# Patient Record
Sex: Female | Born: 1985 | Race: Black or African American | Hispanic: No | Marital: Married | Smoking: Never smoker
Health system: Southern US, Community
[De-identification: ages and names within clinical notes are randomized; demographics above are authoritative.]

## PROBLEM LIST (undated history)

## (undated) ENCOUNTER — Inpatient Hospital Stay (HOSPITAL_COMMUNITY): Payer: Self-pay

## (undated) DIAGNOSIS — I10 Essential (primary) hypertension: Secondary | ICD-10-CM

## (undated) DIAGNOSIS — O139 Gestational [pregnancy-induced] hypertension without significant proteinuria, unspecified trimester: Secondary | ICD-10-CM

## (undated) DIAGNOSIS — D219 Benign neoplasm of connective and other soft tissue, unspecified: Secondary | ICD-10-CM

## (undated) DIAGNOSIS — O24419 Gestational diabetes mellitus in pregnancy, unspecified control: Secondary | ICD-10-CM

## (undated) HISTORY — DX: Gestational (pregnancy-induced) hypertension without significant proteinuria, unspecified trimester: O13.9

## (undated) HISTORY — DX: Gestational diabetes mellitus in pregnancy, unspecified control: O24.419

## (undated) HISTORY — PX: NO PAST SURGERIES: SHX2092

## (undated) HISTORY — DX: Morbid (severe) obesity due to excess calories: E66.01

## (undated) HISTORY — DX: Essential (primary) hypertension: I10

---

## 2012-04-23 LAB — HM PAP SMEAR: HM Pap smear: 4

## 2016-04-20 ENCOUNTER — Ambulatory Visit: Payer: 59 | Admitting: Family Medicine

## 2016-04-23 ENCOUNTER — Telehealth: Payer: Self-pay | Admitting: *Deleted

## 2016-04-23 ENCOUNTER — Encounter: Payer: Self-pay | Admitting: *Deleted

## 2016-04-23 NOTE — Telephone Encounter (Signed)
Pre-Visit Call completed with patient and chart updated.   Pre-Visit Info documented in Specialty Comments under [employer].    

## 2016-04-24 ENCOUNTER — Ambulatory Visit (INDEPENDENT_AMBULATORY_CARE_PROVIDER_SITE_OTHER): Payer: 59 | Admitting: Family Medicine

## 2016-04-24 ENCOUNTER — Encounter: Payer: Self-pay | Admitting: Family Medicine

## 2016-04-24 VITALS — BP 136/74 | HR 60 | Temp 98.6°F | Ht 67.0 in | Wt 267.2 lb

## 2016-04-24 DIAGNOSIS — Z13220 Encounter for screening for lipoid disorders: Secondary | ICD-10-CM | POA: Diagnosis not present

## 2016-04-24 DIAGNOSIS — Z0000 Encounter for general adult medical examination without abnormal findings: Secondary | ICD-10-CM | POA: Diagnosis not present

## 2016-04-24 DIAGNOSIS — Z114 Encounter for screening for human immunodeficiency virus [HIV]: Secondary | ICD-10-CM | POA: Diagnosis not present

## 2016-04-24 DIAGNOSIS — E669 Obesity, unspecified: Secondary | ICD-10-CM | POA: Insufficient documentation

## 2016-04-24 HISTORY — DX: Morbid (severe) obesity due to excess calories: E66.01

## 2016-04-24 LAB — COMPREHENSIVE METABOLIC PANEL
ALBUMIN: 4.1 g/dL (ref 3.6–5.1)
ALK PHOS: 51 U/L (ref 33–115)
ALT: 12 U/L (ref 6–29)
AST: 16 U/L (ref 10–30)
BUN: 10 mg/dL (ref 7–25)
CALCIUM: 9.4 mg/dL (ref 8.6–10.2)
CO2: 26 mmol/L (ref 20–31)
Chloride: 102 mmol/L (ref 98–110)
Creat: 1.02 mg/dL (ref 0.50–1.10)
Glucose, Bld: 93 mg/dL (ref 65–99)
POTASSIUM: 4.4 mmol/L (ref 3.5–5.3)
Sodium: 137 mmol/L (ref 135–146)
TOTAL PROTEIN: 7.3 g/dL (ref 6.1–8.1)
Total Bilirubin: 0.4 mg/dL (ref 0.2–1.2)

## 2016-04-24 LAB — LIPID PANEL
CHOL/HDL RATIO: 2.9 ratio (ref <=5.0)
CHOLESTEROL: 163 mg/dL (ref 125–200)
HDL: 56 mg/dL (ref >=46)
LDL Cholesterol: 91 mg/dL (ref <130)
TRIGLYCERIDES: 80 mg/dL (ref <150)
VLDL: 16 mg/dL (ref <30)

## 2016-04-24 NOTE — Progress Notes (Signed)
Pre visit review using our clinic review tool, if applicable. No additional management support is needed unless otherwise documented below in the visit note. 

## 2016-04-24 NOTE — Progress Notes (Signed)
estab

## 2016-04-24 NOTE — Progress Notes (Signed)
Chief Complaint  Patient presents with  . Establish Care    Pt reports no concerns     Well Woman Maureen Griffin is here for a complete physical.   Her last physical was 1 year(s) ago.  Current diet: improving, just joined weight watchers. Current exercise: Goes to gym 2-3 times weekliy Denies daytime fatigue Her last pap smear was normal.  4 years ago, never told she has had an abnormal period. Patient's last menstrual period was 04/20/2016..  Follow with a Dentist? Yes.   Follow with an Optometrist? No. Home safety concerns? No. Concerns: None  Past Medical History:  Diagnosis Date  . Morbid obesity ([place]) 04/24/2016    Past Surgical History:  Procedure Laterality Date  . NO PAST SURGERIES     Medications  Takes no medications routinely.   Allergies No Known Allergies  Review of Systems: Constitutional:  no unexpected change in weight, no weakness, no unexplained fevers, sweats, or chills Eye:  no recent significant change in vision Ear/Nose/Mouth/Throat:  Ears:  no tinnitus or vertigo and no recent change in hearing, Nose/Mouth/Throat:  no complaints of nasal congestion or discharge, no sore throat and no recent change in voice or hoarseness Cardiovascular:  no exercise intolerance, no chest pain, no palpitations Respiratory:  no chronic cough, sputum, or hemoptysis and no shortness of breath Gastrointestinal:  no abdominal pain, no change in bowel habits, no significant change in appetite, no nausea, vomiting, diarrhea, or constipation and no black or bloody stool GU:  Female: negative for dysuria, frequency, and incontinence, Normal menses; no abnormal bleeding, pelvic pain, or discharge Musculoskeletal/Extremities:  no pain, redness, or swelling of the joints Integumentary (Skin/Breast):  no abnormal skin lesions reported, no new breast lumps or masses Neurologic:  no chronic headaches, no numbness, tingling, or tremor Psychiatric:  no anxiety, no  depression Endocrine:  denies fatigue, weight changes, heat/cold intolerance, bowel or skin changes, or cardiovascular system symptoms Hematologic/Lymphatic:  no abnormal bleeding, no HIV risk factors, no night sweats, no swollen nodes, no weight loss Allergic/Immunologic:  no history of food or environmental allergies  Exam BP 136/74 (BP Location: Left Arm, Patient Position: Sitting, Cuff Size: Large)   Pulse 60   Temp 98.6 F (37 C) (Oral)   Ht 5\' 7"  (1.702 m)   Wt 267 lb 3.2 oz (121.2 kg)   LMP 04/20/2016   SpO2 99%   BMI 41.85 kg/m  General:  well developed, well nourished, in no apparent distress Skin:  no significant moles, warts, or growths Head:  no masses, lesions, or tenderness Eyes:  pupils equal and round, sclera anicteric without injection Ears:  canals without lesions, TMs shiny without retraction, no obvious effusion, no erythema Nose:  nares patent, septum midline, mucosa normal, and no drainage or sinus tenderness Throat/Pharynx:  lips and gingiva without lesion; tongue and uvula midline; non-inflamed pharynx; no exudates or postnasal drainage Neck: neck supple without adenopathy, thyromegaly, or masses Thorax:  nontender Lungs:  clear to auscultation, breath sounds equal bilaterally, no respiratory distress Cardio:  regular rate and rhythm without murmurs, heart sounds without clicks or rubs, point of maximal impulse normal; no lifts, heaves, or thrills Abdomen:  abdomen soft, nontender; bowel sounds normal; no masses or organomegaly Genital: Female:  Not done today Musculoskeletal:  symmetrical muscle groups noted without atrophy or deformity Extremities:  no clubbing, cyanosis, or edema, no deformities, no skin discoloration Neuro:  gait normal; difficult time relaxing for LE DTR's, 1/4 biceps reflex b/l wo clonus  Psych: well oriented with normal range of affect and appropriate judgment/insight  Assessment and Plan  Well adult exam  Screening for HIV (human  immunodeficiency virus) - Plan: HIV antibody  Morbid obesity ([place]) - Plan: CANCELED: Comprehensive metabolic panel  Screening cholesterol level - Plan: CANCELED: Lipid panel   Well 30 y.o. female. Counseled on diet and exercise. She is exercising well and working to improve her diet. Immunizations, labs, and further orders as above. Follow up in 1 year pending the above workup- if she would like to schedule her pap smear with me, schedule in next 2-4 weeks. The patient voiced understanding and agreement to the plan.  [place], DO 04/24/16 3:51 PM

## 2016-04-25 LAB — HIV ANTIBODY (ROUTINE TESTING W REFLEX): HIV 1&2 Ab, 4th Generation: NONREACTIVE

## 2016-05-22 ENCOUNTER — Encounter: Payer: Self-pay | Admitting: Family Medicine

## 2016-05-22 ENCOUNTER — Ambulatory Visit (INDEPENDENT_AMBULATORY_CARE_PROVIDER_SITE_OTHER): Payer: 59 | Admitting: Family Medicine

## 2016-05-22 ENCOUNTER — Ambulatory Visit: Payer: 59 | Admitting: Family Medicine

## 2016-05-22 DIAGNOSIS — Z0189 Encounter for other specified special examinations: Secondary | ICD-10-CM | POA: Diagnosis not present

## 2016-05-22 DIAGNOSIS — Z6841 Body Mass Index (BMI) 40.0 and over, adult: Secondary | ICD-10-CM | POA: Diagnosis not present

## 2016-05-22 NOTE — Progress Notes (Signed)
Chief Complaint  Patient presents with  . Follow-up    Subjective: Patient is a 30 y.o. female here for lab review.  Someone called pt and stated she needed to return, so she is here. We reviewed her labs. She is not having any other issues. She has not set up with GYN for her pap smear, which she is due for.   Past Medical History:  Diagnosis Date  . Morbid obesity ([place]) 04/24/2016   No Known Allergies  Takes no medications routinely.  Objective: BP 112/80 (BP Location: Left Arm, Patient Position: Sitting, Cuff Size: Large)   Pulse (!) 57   Temp 98.3 F (36.8 C) (Oral)   Ht 5\' 7"  (1.702 m)   Wt 260 lb 3.2 oz (118 kg)   LMP 05/18/2016   SpO2 99%   BMI 40.75 kg/m  General: Awake, appears stated age Heart: RRR, no murmurs  Lungs: CTAB, no rales, wheezes or rhonchi. No accessory muscle use Psych: Age appropriate judgment and insight, normal affect and mood  Assessment and Plan: Morbid obesity ([place])  Reviewed recent labs. Encouraged pt to schedule with GYN for pap smear, she is due. F/u for her yearly physical or prn. The patient voiced understanding and agreement to the plan.  [place], DO 05/22/16  9:53 AM

## 2016-05-22 NOTE — Patient Instructions (Signed)
Make sure you get set up with a GYN for your women's health.

## 2016-05-22 NOTE — Progress Notes (Signed)
Pre visit review using our clinic review tool, if applicable. No additional management support is needed unless otherwise documented below in the visit note. 

## 2016-07-06 NOTE — L&D Delivery Note (Signed)
Delivery Note At 3:10 PM a viable female was delivered via Vaginal, Spontaneous Delivery (Presentation: ROA).  APGAR: 9, 9; delivered without difficulty over an intact perineum.  3VC, delayed cord clamping.  Placenta intact, discarded.    Anesthesia:  epidural Episiotomy: None Lacerations: None Est. Blood Loss (mL): 150 Fundus firm   Mom to postpartum.  Baby to Couplet care / Skin to Skin.  Crista Luria, SNM 05/02/2017, 3:20 PM I was present for this delivery and agree with the above assessment

## 2016-09-21 ENCOUNTER — Ambulatory Visit (INDEPENDENT_AMBULATORY_CARE_PROVIDER_SITE_OTHER): Payer: 59 | Admitting: Obstetrics & Gynecology

## 2016-09-21 ENCOUNTER — Encounter: Payer: Self-pay | Admitting: Obstetrics & Gynecology

## 2016-09-21 VITALS — BP 141/69 | HR 81 | Ht 68.0 in | Wt 263.0 lb

## 2016-09-21 DIAGNOSIS — Z3201 Encounter for pregnancy test, result positive: Secondary | ICD-10-CM

## 2016-09-21 DIAGNOSIS — Z01419 Encounter for gynecological examination (general) (routine) without abnormal findings: Secondary | ICD-10-CM | POA: Diagnosis not present

## 2016-09-21 DIAGNOSIS — Z1151 Encounter for screening for human papillomavirus (HPV): Secondary | ICD-10-CM

## 2016-09-21 DIAGNOSIS — Z0000 Encounter for general adult medical examination without abnormal findings: Secondary | ICD-10-CM

## 2016-09-21 DIAGNOSIS — N912 Amenorrhea, unspecified: Secondary | ICD-10-CM

## 2016-09-21 LAB — POCT URINE PREGNANCY: Preg Test, Ur: POSITIVE — AB

## 2016-09-21 NOTE — Progress Notes (Signed)
Subjective:     Maureen Griffin is a 31 y.o. female here for a routine exam.  G1P0 LMP 08/10/2016. Pt reports monthly cycles.  Current complaints: + UPT at home 2 weeks prev.  Pt denies N/V. She c/o breast tenderness.   Gynecologic History Patient's last menstrual period was 08/10/2016 (exact date). Contraception: none Last Pap: 4 year. Results were: normal Last mammogram: n/a.   Obstetric History OB History  Gravida Para Term Preterm AB Living  1            SAB TAB Ectopic Multiple Live Births               # Outcome Date GA Lbr Len/2nd Weight Sex Delivery Anes PTL Lv  1 Gravida              The following portions of the patient's history were reviewed and updated as appropriate: allergies, current medications, past family history, past medical history, past social history, past surgical history and problem list.  Review of Systems Pertinent items are noted in HPI.    Objective:   BP (!) 141/69 (BP Location: Left Arm)   Pulse 81   Ht 5\' 8"  (1.727 m)   Wt 263 lb (119.3 kg)   LMP 08/10/2016 (Exact Date) Comment: patient had positive home pregnancy test  BMI 39.99 kg/m  General Appearance:    Alert, cooperative, no distress, appears stated age  Head:    Normocephalic, without obvious abnormality, atraumatic  Eyes:    conjunctiva/corneas clear, EOM's intact, both eyes  Ears:    Normal external ear canals, both ears  Nose:   Nares normal, septum midline, mucosa normal, no drainage    or sinus tenderness  Throat:   Lips, mucosa, and tongue normal; teeth and gums normal  Neck:   Supple, symmetrical, trachea midline, no adenopathy;    thyroid:  no enlargement/tenderness/nodules  Back:     Symmetric, no curvature, ROM normal, no CVA tenderness  Lungs:     Clear to auscultation bilaterally, respirations unlabored  Chest Wall:    No tenderness or deformity   Heart:    Regular rate and rhythm, S1 and S2 normal, no murmur, rub   or gallop  Breast Exam:    No tenderness, masses, or  nipple abnormality  Abdomen:     Soft, non-tender, bowel sounds active all four quadrants,    no masses, no organomegaly  Genitalia:    Normal female without lesion, discharge or tenderness     Extremities:   Extremities normal, atraumatic, no cyanosis or edema  Pulses:   2+ and symmetric all extremities  Skin:   Skin color, texture, turgor normal, no rashes or lesions   Urine: UPT- pos  Assessment:    Healthy female exam.   Early pregnancy just diagnosed    Plan:    Follow up in: 4 week.   for initial OB intake (exam done today) f/u PAP with HPV and cx First tri genetic screen Prenatal labs obtained today  Eline Geng L. Harraway-Smith, M.D., Cherlynn June

## 2016-09-21 NOTE — Patient Instructions (Signed)
First Trimester of Pregnancy The first trimester of pregnancy is from week 1 until the end of week 13 (months 1 through 3). A week after a sperm fertilizes an egg, the egg will implant on the wall of the uterus. This embryo will begin to develop into a baby. Genes from you and your partner will form the baby. The female genes will determine whether the baby will be a boy or a girl. At 6-8 weeks, the eyes and face will be formed, and the heartbeat can be seen on ultrasound. At the end of 12 weeks, all the baby's organs will be formed. Now that you are pregnant, you will want to do everything you can to have a healthy baby. Two of the most important things are to get good prenatal care and to follow your health care provider's instructions. Prenatal care is all the medical care you receive before the baby's birth. This care will help prevent, find, and treat any problems during the pregnancy and childbirth. Body changes during your first trimester Your body goes through many changes during pregnancy. The changes vary from woman to woman.  You may gain or lose a couple of pounds at first.  You may feel sick to your stomach (nauseous) and you may throw up (vomit). If the vomiting is uncontrollable, call your health care provider.  You may tire easily.  You may develop headaches that can be relieved by medicines. All medicines should be approved by your health care provider.  You may urinate more often. Painful urination may mean you have a bladder infection.  You may develop heartburn as a result of your pregnancy.  You may develop constipation because certain hormones are causing the muscles that push stool through your intestines to slow down.  You may develop hemorrhoids or swollen veins (varicose veins).  Your breasts may begin to grow larger and become tender. Your nipples may stick out more, and the tissue that surrounds them (areola) may become darker.  Your gums may bleed and may be  sensitive to brushing and flossing.  Dark spots or blotches (chloasma, mask of pregnancy) may develop on your face. This will likely fade after the baby is born.  Your menstrual periods will stop.  You may have a loss of appetite.  You may develop cravings for certain kinds of food.  You may have changes in your emotions from day to day, such as being excited to be pregnant or being concerned that something may go wrong with the pregnancy and baby.  You may have more vivid and strange dreams.  You may have changes in your hair. These can include thickening of your hair, rapid growth, and changes in texture. Some women also have hair loss during or after pregnancy, or hair that feels dry or thin. Your hair will most likely return to normal after your baby is born.  What to expect at prenatal visits During a routine prenatal visit:  You will be weighed to make sure you and the baby are growing normally.  Your blood pressure will be taken.  Your abdomen will be measured to track your baby's growth.  The fetal heartbeat will be listened to between weeks 10 and 14 of your pregnancy.  Test results from any previous visits will be discussed.  Your health care provider may ask you:  How you are feeling.  If you are feeling the baby move.  If you have had any abnormal symptoms, such as leaking fluid, bleeding, severe headaches,   or abdominal cramping.  If you are using any tobacco products, including cigarettes, chewing tobacco, and electronic cigarettes.  If you have any questions.  Other tests that may be performed during your first trimester include:  Blood tests to find your blood type and to check for the presence of any previous infections. The tests will also be used to check for low iron levels (anemia) and protein on red blood cells (Rh antibodies). Depending on your risk factors, or if you previously had diabetes during pregnancy, you may have tests to check for high blood  sugar that affects pregnant women (gestational diabetes).  Urine tests to check for infections, diabetes, or protein in the urine.  An ultrasound to confirm the proper growth and development of the baby.  Fetal screens for spinal cord problems (spina bifida) and Down syndrome.  HIV (human immunodeficiency virus) testing. Routine prenatal testing includes screening for HIV, unless you choose not to have this test.  You may need other tests to make sure you and the baby are doing well.  Follow these instructions at home: Medicines  Follow your health care provider's instructions regarding medicine use. Specific medicines may be either safe or unsafe to take during pregnancy.  Take a prenatal vitamin that contains at least 600 micrograms (mcg) of folic acid.  If you develop constipation, try taking a stool softener if your health care provider approves. Eating and drinking  Eat a balanced diet that includes fresh fruits and vegetables, whole grains, good sources of protein such as meat, eggs, or tofu, and low-fat dairy. Your health care provider will help you determine the amount of weight gain that is right for you.  Avoid raw meat and uncooked cheese. These carry germs that can cause birth defects in the baby.  Eating four or five small meals rather than three large meals a day may help relieve nausea and vomiting. If you start to feel nauseous, eating a few soda crackers can be helpful. Drinking liquids between meals, instead of during meals, also seems to help ease nausea and vomiting.  Limit foods that are high in fat and processed sugars, such as fried and sweet foods.  To prevent constipation: ? Eat foods that are high in fiber, such as fresh fruits and vegetables, whole grains, and beans. ? Drink enough fluid to keep your urine clear or pale yellow. Activity  Exercise only as directed by your health care provider. Most women can continue their usual exercise routine during  pregnancy. Try to exercise for 30 minutes at least 5 days a week. Exercising will help you: ? Control your weight. ? Stay in shape. ? Be prepared for labor and delivery.  Experiencing pain or cramping in the lower abdomen or lower back is a good sign that you should stop exercising. Check with your health care provider before continuing with normal exercises.  Try to avoid standing for long periods of time. Move your legs often if you must stand in one place for a long time.  Avoid heavy lifting.  Wear low-heeled shoes and practice good posture.  You may continue to have sex unless your health care provider tells you not to. Relieving pain and discomfort  Wear a good support bra to relieve breast tenderness.  Take warm sitz baths to soothe any pain or discomfort caused by hemorrhoids. Use hemorrhoid cream if your health care provider approves.  Rest with your legs elevated if you have leg cramps or low back pain.  If you develop   varicose veins in your legs, wear support hose. Elevate your feet for 15 minutes, 3-4 times a day. Limit salt in your diet. Prenatal care  Schedule your prenatal visits by the twelfth week of pregnancy. They are usually scheduled monthly at first, then more often in the last 2 months before delivery.  Write down your questions. Take them to your prenatal visits.  Keep all your prenatal visits as told by your health care provider. This is important. Safety  Wear your seat belt at all times when driving.  Make a list of emergency phone numbers, including numbers for family, friends, the hospital, and police and fire departments. General instructions  Ask your health care provider for a referral to a local prenatal education class. Begin classes no later than the beginning of month 6 of your pregnancy.  Ask for help if you have counseling or nutritional needs during pregnancy. Your health care provider can offer advice or refer you to specialists for help  with various needs.  Do not use hot tubs, steam rooms, or saunas.  Do not douche or use tampons or scented sanitary pads.  Do not cross your legs for long periods of time.  Avoid cat litter boxes and soil used by cats. These carry germs that can cause birth defects in the baby and possibly loss of the fetus by miscarriage or stillbirth.  Avoid all smoking, herbs, alcohol, and medicines not prescribed by your health care provider. Chemicals in these products affect the formation and growth of the baby.  Do not use any products that contain nicotine or tobacco, such as cigarettes and e-cigarettes. If you need help quitting, ask your health care provider. You may receive counseling support and other resources to help you quit.  Schedule a dentist appointment. At home, brush your teeth with a soft toothbrush and be gentle when you floss. Contact a health care provider if:  You have dizziness.  You have mild pelvic cramps, pelvic pressure, or nagging pain in the abdominal area.  You have persistent nausea, vomiting, or diarrhea.  You have a bad smelling vaginal discharge.  You have pain when you urinate.  You notice increased swelling in your face, hands, legs, or ankles.  You are exposed to fifth disease or chickenpox.  You are exposed to German measles (rubella) and have never had it. Get help right away if:  You have a fever.  You are leaking fluid from your vagina.  You have spotting or bleeding from your vagina.  You have severe abdominal cramping or pain.  You have rapid weight gain or loss.  You vomit blood or material that looks like coffee grounds.  You develop a severe headache.  You have shortness of breath.  You have any kind of trauma, such as from a fall or a car accident. Summary  The first trimester of pregnancy is from week 1 until the end of week 13 (months 1 through 3).  Your body goes through many changes during pregnancy. The changes vary from  woman to woman.  You will have routine prenatal visits. During those visits, your health care provider will examine you, discuss any test results you may have, and talk with you about how you are feeling. This information is not intended to replace advice given to you by your health care provider. Make sure you discuss any questions you have with your health care provider. Document Released: 06/16/2001 Document Revised: 06/03/2016 Document Reviewed: 06/03/2016 Elsevier Interactive Patient Education  2017 [employer]  

## 2016-09-21 NOTE — Progress Notes (Signed)
Patient had positive home pregnancy test two weeks ago. Kathrene Alu RN BSN

## 2016-09-22 LAB — OBSTETRIC PANEL, INCLUDING HIV
Antibody Screen: NEGATIVE
BASOS ABS: 0 10*3/uL (ref 0.0–0.2)
BASOS: 0 %
EOS (ABSOLUTE): 0.1 10*3/uL (ref 0.0–0.4)
Eos: 1 %
HEMATOCRIT: 37.3 % (ref 34.0–46.6)
HIV Screen 4th Generation wRfx: NONREACTIVE
Hemoglobin: 12.5 g/dL (ref 11.1–15.9)
Hepatitis B Surface Ag: NEGATIVE
IMMATURE GRANS (ABS): 0 10*3/uL (ref 0.0–0.1)
IMMATURE GRANULOCYTES: 0 %
Lymphocytes Absolute: 3.1 10*3/uL (ref 0.7–3.1)
Lymphs: 34 %
MCH: 28 pg (ref 26.6–33.0)
MCHC: 33.5 g/dL (ref 31.5–35.7)
MCV: 83 fL (ref 79–97)
MONOCYTES: 5 %
MONOS ABS: 0.5 10*3/uL (ref 0.1–0.9)
NEUTROS PCT: 60 %
Neutrophils Absolute: 5.4 10*3/uL (ref 1.4–7.0)
PLATELETS: 288 10*3/uL (ref 150–379)
RBC: 4.47 x10E6/uL (ref 3.77–5.28)
RDW: 14.5 % (ref 12.3–15.4)
RPR Ser Ql: NONREACTIVE
RUBELLA: 6.61 {index} (ref >0.99)
Rh Factor: POSITIVE
WBC: 9 10*3/uL (ref 3.4–10.8)

## 2016-09-22 LAB — SICKLE CELL SCREEN: SICKLE CELL SCREEN: NEGATIVE

## 2016-09-24 LAB — CULTURE, URINE COMPREHENSIVE

## 2016-09-24 LAB — CYTOLOGY - PAP
CHLAMYDIA, DNA PROBE: POSITIVE — AB
Diagnosis: NEGATIVE
HPV: NOT DETECTED
NEISSERIA GONORRHEA: NEGATIVE

## 2016-09-28 ENCOUNTER — Telehealth: Payer: Self-pay

## 2016-09-28 ENCOUNTER — Other Ambulatory Visit: Payer: Self-pay | Admitting: Obstetrics & Gynecology

## 2016-09-28 DIAGNOSIS — O98811 Other maternal infectious and parasitic diseases complicating pregnancy, first trimester: Principal | ICD-10-CM

## 2016-09-28 DIAGNOSIS — A749 Chlamydial infection, unspecified: Secondary | ICD-10-CM

## 2016-09-28 MED ORDER — AZITHROMYCIN 500 MG PO TABS: 1000.0000 mg | ORAL_TABLET | Freq: Once | ORAL | 1 refills | Status: AC

## 2016-09-28 NOTE — Telephone Encounter (Signed)
Spoke with patient regarding her positive Chlamydia result. Patient made aware that she needs to pick up her medication from the pharmacy and take it now. Patient also made aware that she needs to abstain from sexual intercourse until her partner is tested and treated otherwise they will continue to pass the infection back and forth. Patient states understanding. Kathrene Alu RNBSN

## 2016-09-30 ENCOUNTER — Telehealth: Payer: Self-pay

## 2016-09-30 MED ORDER — AZITHROMYCIN 500 MG PO TABS: ORAL_TABLET | ORAL | 0 refills | Status: DC

## 2016-09-30 NOTE — Telephone Encounter (Signed)
Patient called stating she picked up her antibiotic on Monday for Chlmydia infection but threw up twenty minutes after taking the medication. Patient instructed that I will resend the prescription in and she needs to have something on her stomach before she take the medication. Patient states understanding. Kathrene Alu RNBSN

## 2016-11-04 ENCOUNTER — Encounter: Payer: Self-pay | Admitting: Obstetrics & Gynecology

## 2016-11-04 ENCOUNTER — Ambulatory Visit (INDEPENDENT_AMBULATORY_CARE_PROVIDER_SITE_OTHER): Payer: 59 | Admitting: Obstetrics & Gynecology

## 2016-11-04 VITALS — BP 151/75 | HR 92 | Wt 256.0 lb

## 2016-11-04 DIAGNOSIS — Z113 Encounter for screening for infections with a predominantly sexual mode of transmission: Secondary | ICD-10-CM

## 2016-11-04 DIAGNOSIS — Z3689 Encounter for other specified antenatal screening: Secondary | ICD-10-CM

## 2016-11-04 DIAGNOSIS — Z3401 Encounter for supervision of normal first pregnancy, first trimester: Secondary | ICD-10-CM | POA: Diagnosis not present

## 2016-11-04 DIAGNOSIS — A749 Chlamydial infection, unspecified: Secondary | ICD-10-CM

## 2016-11-04 DIAGNOSIS — O98819 Other maternal infectious and parasitic diseases complicating pregnancy, unspecified trimester: Secondary | ICD-10-CM

## 2016-11-04 DIAGNOSIS — Z3490 Encounter for supervision of normal pregnancy, unspecified, unspecified trimester: Secondary | ICD-10-CM

## 2016-11-04 DIAGNOSIS — O0990 Supervision of high risk pregnancy, unspecified, unspecified trimester: Secondary | ICD-10-CM | POA: Insufficient documentation

## 2016-11-04 DIAGNOSIS — Z3400 Encounter for supervision of normal first pregnancy, unspecified trimester: Secondary | ICD-10-CM

## 2016-11-04 DIAGNOSIS — O98811 Other maternal infectious and parasitic diseases complicating pregnancy, first trimester: Secondary | ICD-10-CM

## 2016-11-04 MED ORDER — GLYCOPYRROLATE 1 MG PO TABS: 1.0000 mg | ORAL_TABLET | Freq: Three times a day (TID) | ORAL | 3 refills | Status: DC

## 2016-11-04 NOTE — Patient Instructions (Signed)
Eating Plan for Pregnant Women While you are pregnant, your body will require additional nutrition to help support your growing baby. It is recommended that you consume:  150 additional calories each day during your first trimester.  300 additional calories each day during your second trimester.  300 additional calories each day during your third trimester. Eating a healthy, well-balanced diet is very important for your health and for your baby's health. You also have a higher need for some vitamins and minerals, such as folic acid, calcium, iron, and vitamin D. What do I need to know about eating during pregnancy?  Do not try to lose weight or go on a diet during pregnancy.  Choose healthy, nutritious foods. Choose  of a sandwich with a glass of milk instead of a candy bar or a high-calorie sugar-sweetened beverage.  Limit your overall intake of foods that have "empty calories." These are foods that have little nutritional value, such as sweets, desserts, candies, sugar-sweetened beverages, and fried foods.  Eat a variety of foods, especially fruits and vegetables.  Take a prenatal vitamin to help meet the additional needs during pregnancy, specifically for folic acid, iron, calcium, and vitamin D.  Remember to stay active. Ask your health care provider for exercise recommendations that are specific to you.  Practice good food safety and cleanliness, such as washing your hands before you eat and after you prepare raw meat. This helps to prevent foodborne illnesses, such as listeriosis, that can be very dangerous for your baby. Ask your health care provider for more information about listeriosis. What does 150 extra calories look like? Healthy options for an additional 150 calories each day could be any of the following:  Plain low-fat yogurt (6-8 oz) with  cup of berries.  1 apple with 2 teaspoons of peanut butter.  Cut-up vegetables with  cup of hummus.  Low-fat chocolate milk  (8 oz or 1 cup).  1 string cheese with 1 medium orange.   of a peanut butter and jelly sandwich on whole-wheat bread (1 tsp of peanut butter). For 300 calories, you could eat two of those healthy options each day. What is a healthy amount of weight to gain? The recommended amount of weight for you to gain is based on your pre-pregnancy BMI. If your pre-pregnancy BMI was:  Less than 18 (underweight), you should gain 28-40 lb.  18-24.9 (normal), you should gain 25-35 lb.  25-29.9 (overweight), you should gain 15-25 lb.  Greater than 30 (obese), you should gain 11-20 lb. What if I am having twins or multiples? Generally, pregnant women who will be having twins or multiples may need to increase their daily calories by 300-600 calories each day. The recommended range for total weight gain is 25-54 lb, depending on your pre-pregnancy BMI. Talk with your health care provider for specific guidance about additional nutritional needs, weight gain, and exercise during your pregnancy. What foods can I eat? Grains  Any grains. Try to choose whole grains, such as whole-wheat bread, oatmeal, or brown rice. Vegetables  Any vegetables. Try to eat a variety of colors and types of vegetables to get a full range of vitamins and minerals. Remember to wash your vegetables well before eating. Fruits  Any fruits. Try to eat a variety of colors and types of fruit to get a full range of vitamins and minerals. Remember to wash your fruits well before eating. Meats and Other Protein Sources  Lean meats, including chicken, [place], fish, and lean cuts of beef,  veal, or pork. Make sure that all meats are cooked to "well done." Tofu. Tempeh. Beans. Eggs. Peanut butter and other nut butters. Seafood, such as shrimp, crab, and lobster. If you choose fish, select types that are higher in omega-3 fatty acids, including salmon, herring, mussels, trout, sardines, and pollock. Make sure that all meats are cooked to food-safe  temperatures. Dairy  Pasteurized milk and milk alternatives. Pasteurized yogurt and pasteurized cheese. Cottage cheese. Sour cream. Beverages  Water. Juices that contain 100% fruit juice or vegetable juice. Caffeine-free teas and decaffeinated coffee. Drinks that contain caffeine are okay to drink, but it is better to avoid caffeine. Keep your total caffeine intake to less than 200 mg each day (12 oz of coffee, tea, or soda) or as directed by your health care provider. Condiments  Any pasteurized condiments. Sweets and Desserts  Any sweets and desserts. Fats and Oils  Any fats and oils. The items listed above may not be a complete list of recommended foods or beverages. Contact your dietitian for more options.  What foods are not recommended? Vegetables  Unpasteurized (raw) vegetable juices. Fruits  Unpasteurized (raw) fruit juices. Meats and Other Protein Sources  Cured meats that have nitrates, such as bacon, salami, and hotdogs. Luncheon meats, bologna, or other deli meats (unless they are reheated until they are steaming hot). Refrigerated pate, meat spreads from a meat counter, smoked seafood that is found in the refrigerated section of a store. Raw fish, such as sushi or sashimi. High mercury content fish, such as tilefish, shark, swordfish, and king mackerel. Raw meats, such as tuna or beef tartare. Undercooked meats and poultry. Make sure that all meats are cooked to food-safe temperatures. Dairy  Unpasteurized (raw) milk and any foods that have raw milk in them. Soft cheeses, such as feta, queso blanco, queso fresco, Brie, Camembert cheeses, blue-veined cheeses, and Panela cheese (unless it is made with pasteurized milk, which must be stated on the label). Beverages  Alcohol. Sugar-sweetened beverages, such as sodas, teas, or energy drinks. Condiments  Homemade fermented foods and drinks, such as pickles, sauerkraut, or kombucha drinks. (Store-bought pasteurized versions of these are  okay.) Other  Salads that are made in the store, such as ham salad, chicken salad, egg salad, tuna salad, and seafood salad. The items listed above may not be a complete list of foods and beverages to avoid. Contact your dietitian for more information.  This information is not intended to replace advice given to you by your health care provider. Make sure you discuss any questions you have with your health care provider. Document Released: 04/06/2014 Document Revised: 11/28/2015 Document Reviewed: 12/05/2013 Elsevier Interactive Patient Education  2017 [employer].

## 2016-11-04 NOTE — Progress Notes (Signed)
  Subjective:    Maureen Griffin is being seen today for her first obstetrical visit.  G1P0. This is a planned pregnancy. She is at [redacted]w[redacted]d gestation. Her obstetrical history is significant for post clhmydia in 1st trimester. Pt reports N/V and spitting. Sx are getting better. Relationship with FOB: spouse, living together. Patient does intend to breast feed. Pregnancy history fully reviewed. Pt reports that she is spitting all day. Has not take meds to stop this.  Patient reports nausea, vomiting and spitting.  Review of Systems:   Review of Systems  Objective:   Physical Exam  Exam BP (!) 151/75   Pulse 92   Wt 256 lb (116.1 kg)   LMP 08/10/2016 (Exact Date) Comment: patient had positive home pregnancy test  BMI 38.92 kg/m  CONSTITUTIONAL: Well-developed, well-nourished female in no acute distress.  HENT:  Normocephalic, atraumatic EYES: Conjunctivae and EOM are normal. No scleral icterus.  NECK: Normal range of motion SKIN: Skin is warm and dry. No rash noted. Not diaphoretic.No pallor. [place]: Alert and oriented to person, place, and time. Normal coordination.   [place] : IUP at 11 weeks 5/7 days   Assessment:    Pregnancy: G1P0 Patient Active Problem List   Diagnosis Date Noted  . Morbid obesity ([place]) 04/24/2016  superversion normal first pregnancy  positive chlamydia - TOC today  Will treat partner today with Azithromycin 1gram   Plan:     Initial labs drawn last visit all WNL. Prenatal vitamins. Problem list reviewed and updated. AFP3 discussed: requested. Role of ultrasound in pregnancy discussed; fetal survey: requested. Amniocentesis discussed: not indicated. Follow up in 4 weeks. 80% of 40 min visit spent on counseling and coordination of care.   Will attempt to get pt in within 1 week for 1st trimester screening   Lavonia Drafts 11/04/2016

## 2016-11-06 LAB — GC/CHLAMYDIA PROBE AMP (~~LOC~~) NOT AT ARMC
CHLAMYDIA, DNA PROBE: NEGATIVE
NEISSERIA GONORRHEA: NEGATIVE

## 2016-11-11 ENCOUNTER — Ambulatory Visit (HOSPITAL_COMMUNITY)
Admission: RE | Admit: 2016-11-11 | Discharge: 2016-11-11 | Disposition: A | Discharge status: Home / Self Care | Payer: 59 | Source: Ambulatory Visit | Attending: Obstetrics & Gynecology | Admitting: Obstetrics & Gynecology

## 2016-11-11 ENCOUNTER — Other Ambulatory Visit: Payer: Self-pay | Admitting: Obstetrics & Gynecology

## 2016-11-11 ENCOUNTER — Encounter (HOSPITAL_COMMUNITY): Payer: Self-pay

## 2016-11-11 DIAGNOSIS — Z3490 Encounter for supervision of normal pregnancy, unspecified, unspecified trimester: Secondary | ICD-10-CM

## 2016-11-11 DIAGNOSIS — E669 Obesity, unspecified: Secondary | ICD-10-CM | POA: Insufficient documentation

## 2016-11-11 DIAGNOSIS — Z3400 Encounter for supervision of normal first pregnancy, unspecified trimester: Secondary | ICD-10-CM

## 2016-11-11 DIAGNOSIS — Z3A13 13 weeks gestation of pregnancy: Secondary | ICD-10-CM | POA: Diagnosis not present

## 2016-11-11 DIAGNOSIS — O99212 Obesity complicating pregnancy, second trimester: Secondary | ICD-10-CM

## 2016-11-11 DIAGNOSIS — Z3682 Encounter for antenatal screening for nuchal translucency: Secondary | ICD-10-CM

## 2016-11-11 DIAGNOSIS — Z6838 Body mass index (BMI) 38.0-38.9, adult: Secondary | ICD-10-CM | POA: Diagnosis not present

## 2016-11-11 DIAGNOSIS — O99211 Obesity complicating pregnancy, first trimester: Secondary | ICD-10-CM | POA: Insufficient documentation

## 2016-11-11 DIAGNOSIS — O3410 Maternal care for benign tumor of corpus uteri, unspecified trimester: Secondary | ICD-10-CM

## 2016-11-11 DIAGNOSIS — D259 Leiomyoma of uterus, unspecified: Secondary | ICD-10-CM | POA: Insufficient documentation

## 2016-11-16 ENCOUNTER — Other Ambulatory Visit: Payer: Self-pay

## 2016-11-16 MED ORDER — DOXYLAMINE-PYRIDOXINE 10-10 MG PO TBEC: 1.0000 | DELAYED_RELEASE_TABLET | Freq: Two times a day (BID) | ORAL | 3 refills | Status: DC

## 2016-12-02 ENCOUNTER — Other Ambulatory Visit: Payer: Self-pay | Admitting: Obstetrics & Gynecology

## 2016-12-02 ENCOUNTER — Ambulatory Visit (INDEPENDENT_AMBULATORY_CARE_PROVIDER_SITE_OTHER): Payer: 59 | Admitting: Obstetrics & Gynecology

## 2016-12-02 VITALS — BP 132/69 | HR 66 | Wt 256.0 lb

## 2016-12-02 DIAGNOSIS — O99212 Obesity complicating pregnancy, second trimester: Secondary | ICD-10-CM

## 2016-12-02 DIAGNOSIS — Z3402 Encounter for supervision of normal first pregnancy, second trimester: Secondary | ICD-10-CM

## 2016-12-02 DIAGNOSIS — Z7689 Persons encountering health services in other specified circumstances: Secondary | ICD-10-CM | POA: Diagnosis not present

## 2016-12-02 DIAGNOSIS — Z3400 Encounter for supervision of normal first pregnancy, unspecified trimester: Secondary | ICD-10-CM

## 2016-12-02 DIAGNOSIS — O3412 Maternal care for benign tumor of corpus uteri, second trimester: Secondary | ICD-10-CM

## 2016-12-02 DIAGNOSIS — D259 Leiomyoma of uterus, unspecified: Secondary | ICD-10-CM

## 2016-12-02 DIAGNOSIS — Z113 Encounter for screening for infections with a predominantly sexual mode of transmission: Secondary | ICD-10-CM | POA: Diagnosis not present

## 2016-12-02 DIAGNOSIS — O98812 Other maternal infectious and parasitic diseases complicating pregnancy, second trimester: Secondary | ICD-10-CM

## 2016-12-02 DIAGNOSIS — O3410 Maternal care for benign tumor of corpus uteri, unspecified trimester: Secondary | ICD-10-CM

## 2016-12-02 DIAGNOSIS — A749 Chlamydial infection, unspecified: Secondary | ICD-10-CM

## 2016-12-02 DIAGNOSIS — R772 Abnormality of alphafetoprotein: Secondary | ICD-10-CM

## 2016-12-02 NOTE — Patient Instructions (Signed)

## 2016-12-02 NOTE — Progress Notes (Signed)
   PRENATAL VISIT NOTE  Subjective:  Maureen Griffin is a 31 y.o. G1P0 at [redacted]w[redacted]d being seen today for ongoing prenatal care.  She is currently monitored for the following issues for this low-risk pregnancy and has Morbid obesity ([place]); Supervision of normal first pregnancy, antepartum; Chlamydia infection affecting pregnancy; and Uterine fibroid during pregnancy, antepartum on her problem list.  Patient reports still with spitting- pt reports that she is used to it. None of the meds have helped..  Contractions: Not present. Vag. Bleeding: None.  Movement: Absent. Denies leaking of fluid.   The following portions of the patient's history were reviewed and updated as appropriate: allergies, current medications, past family history, past medical history, past social history, past surgical history and problem list. Problem list updated.  Objective:   Vitals:   12/02/16 1435  BP: 132/69  Pulse: 66  Weight: 256 lb (116.1 kg)    Fetal Status: Fetal Heart Rate (bpm): 153   Movement: Absent     General:  Alert, oriented and cooperative. Patient is in no acute distress.  Skin: Skin is warm and dry. No rash noted.   Cardiovascular: Normal heart rate noted  Respiratory: Normal respiratory effort, no problems with respiration noted  Abdomen: Soft, gravid, appropriate for gestational age. Pain/Pressure: Absent     Pelvic:  Cervical exam deferred        Extremities: Normal range of motion.  Edema: None  Mental Status: Normal mood and affect. Normal behavior. Normal judgment and thought content.   Assessment and Plan:  Pregnancy: G1P0 at [redacted]w[redacted]d  1. Supervision of normal first pregnancy, antepartum AFP today [place] for anatomy in 2 weeks  2. Morbid obesity ([place])  3. Chlamydia infection affecting pregnancy in second trimester Need TOC in 1 month    4. Uterine fibroid during pregnancy, antepartum  5. Ptyalism Stable  Preterm labor symptoms and general obstetric precautions including but not  limited to vaginal bleeding, contractions, leaking of fluid and fetal movement were reviewed in detail with the patient. Please refer to After Visit Summary for other counseling recommendations.  Return in about 4 weeks (around 12/30/2016).   Lavonia Drafts, MD

## 2016-12-18 ENCOUNTER — Ambulatory Visit (HOSPITAL_COMMUNITY)
Admission: RE | Admit: 2016-12-18 | Discharge: 2016-12-18 | Disposition: A | Discharge status: Home / Self Care | Payer: 59 | Source: Ambulatory Visit | Attending: Obstetrics & Gynecology | Admitting: Obstetrics & Gynecology

## 2016-12-18 ENCOUNTER — Other Ambulatory Visit: Payer: Self-pay | Admitting: Obstetrics & Gynecology

## 2016-12-18 DIAGNOSIS — Z363 Encounter for antenatal screening for malformations: Secondary | ICD-10-CM | POA: Insufficient documentation

## 2016-12-18 DIAGNOSIS — Z3400 Encounter for supervision of normal first pregnancy, unspecified trimester: Secondary | ICD-10-CM

## 2016-12-18 DIAGNOSIS — D259 Leiomyoma of uterus, unspecified: Secondary | ICD-10-CM | POA: Diagnosis not present

## 2016-12-18 DIAGNOSIS — O99212 Obesity complicating pregnancy, second trimester: Secondary | ICD-10-CM | POA: Diagnosis not present

## 2016-12-18 DIAGNOSIS — Z3A18 18 weeks gestation of pregnancy: Secondary | ICD-10-CM | POA: Diagnosis not present

## 2016-12-18 DIAGNOSIS — O3412 Maternal care for benign tumor of corpus uteri, second trimester: Secondary | ICD-10-CM | POA: Diagnosis not present

## 2016-12-18 LAB — AFP TETRA
DIA Mom Value: 2.06
DIA Value (EIA): 257.73 pg/mL
DSR (BY AGE) 1 IN: 630
DSR (Second Trimester) 1 IN: 10000
Gestational Age: 16.3 WEEKS
MSAFP MOM: 2.92
MSAFP: 80.3 ng/mL
MSHCG Mom: 2.4
MSHCG: 62363 m[IU]/mL
Maternal Age At EDD: 30.8 yr
OSB RISK: 269
Test Results:: POSITIVE — AB
UE3 VALUE: 0.9 ng/mL
WEIGHT: 256 [lb_av]
uE3 Mom: 1.24

## 2016-12-18 LAB — SPECIMEN STATUS REPORT

## 2016-12-29 ENCOUNTER — Telehealth: Payer: Self-pay | Admitting: Obstetrics & Gynecology

## 2016-12-29 DIAGNOSIS — R772 Abnormality of alphafetoprotein: Secondary | ICD-10-CM | POA: Insufficient documentation

## 2016-12-29 NOTE — Telephone Encounter (Signed)
TC to pt. Pt has appt tomorrow. Will address at that visit.  clh-S

## 2016-12-29 NOTE — Progress Notes (Signed)
TC to pt. Left message. Pt has appt tomorrow. Will review anatomy scan and elevated AFP in the face of a normal anatomy scan.  Tailey Top L. Harraway-Smith, M.D., Cherlynn June

## 2016-12-30 ENCOUNTER — Ambulatory Visit (INDEPENDENT_AMBULATORY_CARE_PROVIDER_SITE_OTHER): Payer: 59 | Admitting: Obstetrics & Gynecology

## 2016-12-30 VITALS — BP 131/64 | HR 71 | Wt 258.0 lb

## 2016-12-30 DIAGNOSIS — R772 Abnormality of alphafetoprotein: Secondary | ICD-10-CM

## 2016-12-30 DIAGNOSIS — Z3402 Encounter for supervision of normal first pregnancy, second trimester: Secondary | ICD-10-CM

## 2016-12-30 DIAGNOSIS — O3410 Maternal care for benign tumor of corpus uteri, unspecified trimester: Secondary | ICD-10-CM

## 2016-12-30 DIAGNOSIS — O98812 Other maternal infectious and parasitic diseases complicating pregnancy, second trimester: Secondary | ICD-10-CM

## 2016-12-30 DIAGNOSIS — O3412 Maternal care for benign tumor of corpus uteri, second trimester: Secondary | ICD-10-CM

## 2016-12-30 DIAGNOSIS — Z3400 Encounter for supervision of normal first pregnancy, unspecified trimester: Secondary | ICD-10-CM

## 2016-12-30 DIAGNOSIS — D259 Leiomyoma of uterus, unspecified: Secondary | ICD-10-CM

## 2016-12-30 DIAGNOSIS — A749 Chlamydial infection, unspecified: Secondary | ICD-10-CM

## 2016-12-30 NOTE — Progress Notes (Signed)
   PRENATAL VISIT NOTE  Subjective:  Maureen Griffin is a 31 y.o. G1P0 at [redacted]w[redacted]d being seen today for ongoing prenatal care.  She is currently monitored for the following issues for this high-risk pregnancy and has Morbid obesity ([place]); Supervision of normal first pregnancy, antepartum; Chlamydia infection affecting pregnancy; Uterine fibroid during pregnancy, antepartum; and Elevated AFP on her problem list.  Patient reports increased spitting/ stable.   Contractions: Not present. Vag. Bleeding: None.  Movement: Present for 2 days. Denies leaking of fluid.   The following portions of the patient's history were reviewed and updated as appropriate: allergies, current medications, past family history, past medical history, past social history, past surgical history and problem list. Problem list updated.  Objective:   Vitals:   12/30/16 1519  BP: 131/64  Pulse: 71  Weight: 258 lb (117 kg)    Fetal Status: Fetal Heart Rate (bpm): 160   Movement: Present     General:  Alert, oriented and cooperative. Patient is in no acute distress.  Skin: Skin is warm and dry. No rash noted.   Cardiovascular: Normal heart rate noted  Respiratory: Normal respiratory effort, no problems with respiration noted  Abdomen: Soft, gravid, appropriate for gestational age. Pain/Pressure: Present     Pelvic:  Cervical exam deferred        Extremities: Normal range of motion.  Edema: None  Mental Status: Normal mood and affect. Normal behavior. Normal judgment and thought content.   Assessment and Plan:  Pregnancy: G1P0 at [redacted]w[redacted]d  1. Supervision of normal first pregnancy, antepartum  2. Uterine fibroid during pregnancy, antepartum  3. Morbid obesity ([facility])  4. Elevated AFP Repeat US in 4 weeks Pt decline further serum testing  5. Chlamydia infection affecting pregnancy in second trimester TOC obtained 11/2016  Preterm labor symptoms and general obstetric precautions including but not limited to vaginal  bleeding, contractions, leaking of fluid and fetal movement were reviewed in detail with the patient. Please refer to After Visit Summary for other counseling recommendations.  Return in about 4 weeks (around 01/27/2017).   Lavonia Drafts, MD

## 2017-01-27 ENCOUNTER — Ambulatory Visit (INDEPENDENT_AMBULATORY_CARE_PROVIDER_SITE_OTHER): Payer: 59 | Admitting: Obstetrics & Gynecology

## 2017-01-27 VITALS — BP 130/60 | HR 70 | Wt 262.0 lb

## 2017-01-27 DIAGNOSIS — O98812 Other maternal infectious and parasitic diseases complicating pregnancy, second trimester: Secondary | ICD-10-CM

## 2017-01-27 DIAGNOSIS — O3412 Maternal care for benign tumor of corpus uteri, second trimester: Secondary | ICD-10-CM

## 2017-01-27 DIAGNOSIS — D259 Leiomyoma of uterus, unspecified: Secondary | ICD-10-CM

## 2017-01-27 DIAGNOSIS — O3410 Maternal care for benign tumor of corpus uteri, unspecified trimester: Secondary | ICD-10-CM

## 2017-01-27 DIAGNOSIS — Z3400 Encounter for supervision of normal first pregnancy, unspecified trimester: Secondary | ICD-10-CM

## 2017-01-27 DIAGNOSIS — R772 Abnormality of alphafetoprotein: Secondary | ICD-10-CM

## 2017-01-27 DIAGNOSIS — Z3402 Encounter for supervision of normal first pregnancy, second trimester: Secondary | ICD-10-CM

## 2017-01-27 DIAGNOSIS — A749 Chlamydial infection, unspecified: Secondary | ICD-10-CM

## 2017-01-27 NOTE — Patient Instructions (Signed)

## 2017-01-27 NOTE — Progress Notes (Signed)
   PRENATAL VISIT NOTE  Subjective:  Maureen Griffin is a 31 y.o. G1P0 at [redacted]w[redacted]d being seen today for ongoing prenatal care.  She is currently monitored for the following issues for this high-risk pregnancy and has Morbid obesity ([place]); Supervision of normal first pregnancy, antepartum; Chlamydia infection affecting pregnancy; Uterine fibroid during pregnancy, antepartum; and Elevated AFP on her problem list.  Patient reports contractions since this am.  Contractions: Not present. Vag. Bleeding: None.  Movement: Present. Denies leaking of fluid.   The following portions of the patient's history were reviewed and updated as appropriate: allergies, current medications, past family history, past medical history, past social history, past surgical history and problem list. Problem list updated.  Objective:   Vitals:   01/27/17 1459  BP: 130/60  Pulse: 70  Weight: 262 lb (118.8 kg)    Fetal Status: Fetal Heart Rate (bpm): 160   Movement: Present     General:  Alert, oriented and cooperative. Patient is in no acute distress.  Skin: Skin is warm and dry. No rash noted.   Cardiovascular: Normal heart rate noted  Respiratory: Normal respiratory effort, no problems with respiration noted  Abdomen: Soft, gravid, appropriate for gestational age.  Pain/Pressure: Present     Pelvic: Cervical exam performed        Extremities: Normal range of motion.  Edema: None  Mental Status:  Normal mood and affect. Normal behavior. Normal judgment and thought content.   Assessment and Plan:  Pregnancy: G1P0 at [redacted]w[redacted]d  1. Supervision of normal first pregnancy, antepartum  2. Uterine fibroid during pregnancy, antepartum  3. Morbid obesity ([facility])  4. Elevated AFP US WNL  5. Chlamydia infection affecting pregnancy in second trimester TOC neg  6. occ ctx  Cervix long and closed.  [place] dPTL instructions   Preterm labor symptoms and general obstetric precautions including but not limited to vaginal  bleeding, contractions, leaking of fluid and fetal movement were reviewed in detail with the patient. Please refer to After Visit Summary for other counseling recommendations.  Return in about 4 weeks (around 02/24/2017) for ob and GTT (morning appt).   Lavonia Drafts, MD

## 2017-01-29 ENCOUNTER — Ambulatory Visit (HOSPITAL_COMMUNITY)
Admission: RE | Admit: 2017-01-29 | Discharge: 2017-01-29 | Disposition: A | Discharge status: Home / Self Care | Payer: 59 | Source: Ambulatory Visit | Attending: Obstetrics & Gynecology | Admitting: Obstetrics & Gynecology

## 2017-01-29 ENCOUNTER — Other Ambulatory Visit: Payer: Self-pay | Admitting: Obstetrics & Gynecology

## 2017-01-29 DIAGNOSIS — Z3A24 24 weeks gestation of pregnancy: Secondary | ICD-10-CM

## 2017-01-29 DIAGNOSIS — O3472 Maternal care for abnormality of vulva and perineum, second trimester: Secondary | ICD-10-CM

## 2017-01-29 DIAGNOSIS — Z362 Encounter for other antenatal screening follow-up: Secondary | ICD-10-CM

## 2017-01-29 DIAGNOSIS — O99212 Obesity complicating pregnancy, second trimester: Secondary | ICD-10-CM

## 2017-01-29 DIAGNOSIS — O281 Abnormal biochemical finding on antenatal screening of mother: Secondary | ICD-10-CM

## 2017-01-29 DIAGNOSIS — D259 Leiomyoma of uterus, unspecified: Secondary | ICD-10-CM | POA: Diagnosis not present

## 2017-01-29 DIAGNOSIS — R772 Abnormality of alphafetoprotein: Secondary | ICD-10-CM | POA: Diagnosis not present

## 2017-01-29 DIAGNOSIS — Z6838 Body mass index (BMI) 38.0-38.9, adult: Secondary | ICD-10-CM | POA: Insufficient documentation

## 2017-01-29 DIAGNOSIS — Z3400 Encounter for supervision of normal first pregnancy, unspecified trimester: Secondary | ICD-10-CM

## 2017-01-29 DIAGNOSIS — O3412 Maternal care for benign tumor of corpus uteri, second trimester: Secondary | ICD-10-CM

## 2017-01-30 ENCOUNTER — Inpatient Hospital Stay (HOSPITAL_COMMUNITY)
Admission: AD | Admit: 2017-01-30 | Discharge: 2017-01-30 | Disposition: A | Discharge status: Home / Self Care | Payer: 59 | Source: Ambulatory Visit | Attending: Obstetrics and Gynecology | Admitting: Obstetrics and Gynecology

## 2017-01-30 ENCOUNTER — Encounter (HOSPITAL_COMMUNITY): Payer: Self-pay

## 2017-01-30 DIAGNOSIS — Z3A24 24 weeks gestation of pregnancy: Secondary | ICD-10-CM | POA: Diagnosis not present

## 2017-01-30 DIAGNOSIS — O99212 Obesity complicating pregnancy, second trimester: Secondary | ICD-10-CM | POA: Insufficient documentation

## 2017-01-30 DIAGNOSIS — R109 Unspecified abdominal pain: Secondary | ICD-10-CM

## 2017-01-30 DIAGNOSIS — R102 Pelvic and perineal pain: Secondary | ICD-10-CM | POA: Diagnosis not present

## 2017-01-30 DIAGNOSIS — D259 Leiomyoma of uterus, unspecified: Secondary | ICD-10-CM | POA: Insufficient documentation

## 2017-01-30 DIAGNOSIS — O3410 Maternal care for benign tumor of corpus uteri, unspecified trimester: Secondary | ICD-10-CM

## 2017-01-30 DIAGNOSIS — O3412 Maternal care for benign tumor of corpus uteri, second trimester: Secondary | ICD-10-CM | POA: Diagnosis not present

## 2017-01-30 DIAGNOSIS — O26892 Other specified pregnancy related conditions, second trimester: Secondary | ICD-10-CM | POA: Diagnosis not present

## 2017-01-30 DIAGNOSIS — N949 Unspecified condition associated with female genital organs and menstrual cycle: Secondary | ICD-10-CM

## 2017-01-30 LAB — CBC WITH DIFFERENTIAL/PLATELET
BASOS ABS: 0 10*3/uL (ref 0.0–0.1)
BASOS PCT: 0 %
EOS ABS: 0.1 10*3/uL (ref 0.0–0.7)
Eosinophils Relative: 1 %
HCT: 33.4 % — ABNORMAL LOW (ref 36.0–46.0)
HEMOGLOBIN: 12.2 g/dL (ref 12.0–15.0)
Lymphocytes Relative: 22 %
Lymphs Abs: 3 10*3/uL (ref 0.7–4.0)
MCH: 29.5 pg (ref 26.0–34.0)
MCHC: 36.5 g/dL — AB (ref 30.0–36.0)
MCV: 80.9 fL (ref 78.0–100.0)
Monocytes Absolute: 0.8 10*3/uL (ref 0.1–1.0)
Monocytes Relative: 6 %
NEUTROS ABS: 9.5 10*3/uL — AB (ref 1.7–7.7)
NEUTROS PCT: 71 %
PLATELETS: 277 10*3/uL (ref 150–400)
RBC: 4.13 MIL/uL (ref 3.87–5.11)
RDW: 13.8 % (ref 11.5–15.5)
WBC: 13.3 10*3/uL — ABNORMAL HIGH (ref 4.0–10.5)

## 2017-01-30 LAB — URINALYSIS, ROUTINE W REFLEX MICROSCOPIC
Bilirubin Urine: NEGATIVE
Glucose, UA: NEGATIVE mg/dL
Hgb urine dipstick: NEGATIVE
KETONES UR: NEGATIVE mg/dL
NITRITE: NEGATIVE
PROTEIN: NEGATIVE mg/dL
Specific Gravity, Urine: 1.002 — ABNORMAL LOW (ref 1.005–1.030)
pH: 6 (ref 5.0–8.0)

## 2017-01-30 MED ORDER — IBUPROFEN 600 MG PO TABS
600.0000 mg | ORAL_TABLET | Freq: Once | ORAL | Status: AC
  Administered 2017-01-30: 600 mg via ORAL
  Filled 2017-01-30: qty 1

## 2017-01-30 MED ORDER — CYCLOBENZAPRINE HCL 10 MG PO TABS
10.0000 mg | ORAL_TABLET | Freq: Once | ORAL | Status: AC
  Administered 2017-01-30: 10 mg via ORAL
  Filled 2017-01-30: qty 1

## 2017-01-30 NOTE — MAU Provider Note (Signed)
History     CSN: [id number]  Arrival date and time: 01/30/17 0246   First Provider Initiated Contact with Patient 01/30/17 0310      Chief Complaint  Patient presents with  . Abdominal Pain   HPI   [name] is a 31 y.o. female G1P0 @ [redacted]w[redacted]d with a history of morbid obesity and uterine fibroids, here in MAU tonight with abdominal pain that radiates around to her lower back. No injury occurred. States the pain started on Wednesday and progressed this morning. States she had a hard time sleeping tonight due to the pain. Says she can feel tightening in her abdomen that comes and goes.   OB History    Gravida Para Term Preterm AB Living   1             SAB TAB Ectopic Multiple Live Births                  Past Medical History:  Diagnosis Date  . Morbid obesity ([place]) 04/24/2016    Past Surgical History:  Procedure Laterality Date  . NO PAST SURGERIES      Family History  Problem Relation Age of Onset  . Cancer Neg Hx   . Diabetes Neg Hx   . Hypertension Neg Hx   . Stroke Neg Hx     Social History  Substance Use Topics  . Smoking status: Never Smoker  . Smokeless tobacco: Never Used  . Alcohol use No    Allergies: No Known Allergies  Prescriptions Prior to Admission  Medication Sig Dispense Refill Last Dose  . Doxylamine-Pyridoxine 10-10 MG TBEC Take 1 tablet by mouth 2 (two) times daily at 8 am and 10 pm. 60 tablet 3 01/29/2017 at Unknown time  . Prenatal Vit-Fe Fumarate-FA (MULTIVITAMIN-PRENATAL) 27-0.8 MG TABS tablet Take 1 tablet by mouth daily at 12 noon.   01/29/2017 at Unknown time  . azithromycin (ZITHROMAX) 500 MG tablet Take 1000 mg once by mouth. (Patient not taking: Reported on 11/04/2016) 2 tablet 0 Not Taking  . glycopyrrolate (ROBINUL) 1 MG tablet Take 1 tablet (1 mg total) by mouth 3 (three) times daily. (Patient not taking: Reported on 11/11/2016) 30 tablet 3 More than a month at Unknown time   Results for orders placed or performed during the  hospital encounter of 01/30/17 (from the past 48 hour(s))  Urinalysis, Routine w reflex microscopic     Status: Abnormal   Collection Time: 01/30/17  2:56 AM  Result Value Ref Range   Color, Urine STRAW (A) YELLOW   APPearance CLEAR CLEAR   Specific Gravity, Urine 1.002 (L) 1.005 - 1.030   pH 6.0 5.0 - 8.0   Glucose, UA NEGATIVE NEGATIVE mg/dL   Hgb urine dipstick NEGATIVE NEGATIVE   Bilirubin Urine NEGATIVE NEGATIVE   Ketones, ur NEGATIVE NEGATIVE mg/dL   Protein, ur NEGATIVE NEGATIVE mg/dL   Nitrite NEGATIVE NEGATIVE   Leukocytes, UA SMALL (A) NEGATIVE   RBC / HPF 0-5 0 - 5 RBC/hpf   WBC, UA 0-5 0 - 5 WBC/hpf   Bacteria, UA RARE (A) NONE SEEN   Squamous Epithelial / LPF 0-5 (A) NONE SEEN  Culture, OB Urine     Status: Abnormal   Collection Time: 01/30/17  2:56 AM  Result Value Ref Range   Specimen Description OB CLEAN CATCH    Special Requests Normal    Culture (A)     MULTIPLE SPECIES PRESENT, SUGGEST RECOLLECTION NO GROUP B STREP (S.AGALACTIAE) ISOLATED  Performed at [facility], [place] [address], [place], [place] [address]    Report Status 01/31/2017 FINAL   CBC with Differential     Status: Abnormal   Collection Time: 01/30/17  3:24 AM  Result Value Ref Range   WBC 13.3 (H) 4.0 - 10.5 K/uL   RBC 4.13 3.87 - 5.11 MIL/uL   Hemoglobin 12.2 12.0 - 15.0 g/dL   HCT 33.4 (L) 36.0 - 46.0 %   MCV 80.9 78.0 - 100.0 fL   MCH 29.5 26.0 - 34.0 pg   MCHC 36.5 (H) 30.0 - 36.0 g/dL   RDW 13.8 11.5 - 15.5 %   Platelets 277 150 - 400 K/uL   Neutrophils Relative % 71 %   Neutro Abs 9.5 (H) 1.7 - 7.7 K/uL   Lymphocytes Relative 22 %   Lymphs Abs 3.0 0.7 - 4.0 K/uL   Monocytes Relative 6 %   Monocytes Absolute 0.8 0.1 - 1.0 K/uL   Eosinophils Relative 1 %   Eosinophils Absolute 0.1 0.0 - 0.7 K/uL   Basophils Relative 0 %   Basophils Absolute 0.0 0.0 - 0.1 K/uL   WBC Morphology ATYPICAL LYMPHOCYTES     Review of Systems  Constitutional: Negative for fever.   Gastrointestinal: Positive for nausea. Negative for vomiting.  Genitourinary: Positive for frequency. Negative for dysuria, flank pain, urgency, vaginal bleeding and vaginal discharge.  Musculoskeletal: Positive for back pain.   Physical Exam   Temperature 98.4 F (36.9 C), temperature source Oral, resp. rate 20, last menstrual period 08/10/2016.  Physical Exam  Constitutional: She is oriented to person, place, and time. She appears well-developed and well-nourished. No distress.  GI: Soft. Normal appearance. There is tenderness in the suprapubic area. There is no rigidity, no rebound, no guarding and no CVA tenderness.  Neurological: She is alert and oriented to person, place, and time.  Skin: Skin is warm. She is not diaphoretic.  Psychiatric: Her behavior is normal.   Fetal Tracing: Baseline: 150 bpm Variability: minimal  Accelerations: 10x10 Decelerations: quick variables  [place]:none Non reactive however appropriate for gestational age. MAU Course  Procedures  None  MDM  Flexeril 10 mg PO & ibuprofen 600 mg PO CBC Urine culture pending  Cervix tightly closed and posterior.  [place] reviewed from yesterday 7/27. Cervical length 3.3, Multiple uterine myomas noted Patient anxious on arrival. Reassured knowing her cervix is closed. Pain resolved following ibuprofen and flexeril.  Assessment and Plan   A:  1. Uterine fibroid during pregnancy, antepartum   2. Abdominal pain in pregnancy, second trimester   3. Round ligament pain     P:  Discharge home in stable condition Pregnancy support belt recommended Return to [facility] if symptoms worsen Patient feeling better after flexeril   Maureen Griffin, Artist Pais, NP 01/31/2017 4:49 PM

## 2017-01-30 NOTE — Discharge Instructions (Signed)
Abdominal Pain During Pregnancy °Belly (abdominal) pain is common during pregnancy. Most of the time, it is not a serious problem. Other times, it can be a sign that something is wrong with the pregnancy. Always tell your doctor if you have belly pain. °Follow these instructions at home: °Monitor your belly pain for any changes. The following actions may help you feel better: °· Do not have sex (intercourse) or put anything in your vagina until you feel better. °· Rest until your pain stops. °· Drink clear fluids if you feel sick to your stomach (nauseous). Do not eat solid food until you feel better. °· Only take medicine as told by your doctor. °· Keep all doctor visits as told. ° °Get help right away if: °· You are bleeding, leaking fluid, or pieces of tissue come out of your vagina. °· You have more pain or cramping. °· You keep throwing up (vomiting). °· You have pain when you pee (urinate) or have blood in your pee. °· You have a fever. °· You do not feel your baby moving as much. °· You feel very weak or feel like passing out. °· You have trouble breathing, with or without belly pain. °· You have a very bad headache and belly pain. °· You have fluid leaking from your vagina and belly pain. °· You keep having watery poop (diarrhea). °· Your belly pain does not go away after resting, or the pain gets worse. °This information is not intended to replace advice given to you by your health care provider. Make sure you discuss any questions you have with your health care provider. °Document Released: 06/10/2009 Document Revised: 01/29/2016 Document Reviewed: 01/19/2013 °Elsevier Interactive Patient Education © 2018 [employer] °Round Ligament Pain °The round ligament is a cord of muscle and tissue that helps to support the uterus. It can become a source of pain during pregnancy if it becomes stretched or twisted as the baby grows. The pain usually begins in the second trimester of pregnancy, and it can come and go  until the baby is delivered. It is not a serious problem, and it does not cause harm to the baby. °Round ligament pain is usually a short, sharp, and pinching pain, but it can also be a dull, lingering, and aching pain. The pain is felt in the lower side of the abdomen or in the groin. It usually starts deep in the groin and moves up to the outside of the hip area. Pain can occur with: °· A sudden change in position. °· Rolling over in bed. °· Coughing or sneezing. °· Physical activity. ° °Follow these instructions at home: °Watch your condition for any changes. Take these steps to help with your pain: °· When the pain starts, relax. Then try: °? Sitting down. °? Flexing your knees up to your abdomen. °? Lying on your side with one pillow under your abdomen and another pillow between your legs. °? Sitting in a warm bath for 15-20 minutes or until the pain goes away. °· Take over-the-counter and prescription medicines only as told by your health care provider. °· Move slowly when you sit and stand. °· Avoid long walks if they cause pain. °· Stop or lessen your physical activities if they cause pain. ° °Contact a health care provider if: °· Your pain does not go away with treatment. °· You feel pain in your back that you did not have before. °· Your medicine is not helping. °Get help right away if: °· You   develop a fever or chills. °· You develop uterine contractions. °· You develop vaginal bleeding. °· You develop nausea or vomiting. °· You develop diarrhea. °· You have pain when you urinate. °This information is not intended to replace advice given to you by your health care provider. Make sure you discuss any questions you have with your health care provider. °Document Released: 03/31/2008 Document Revised: 11/28/2015 Document Reviewed: 08/29/2014 °Elsevier Interactive Patient Education © 2018 [employer] ° °

## 2017-01-30 NOTE — MAU Note (Signed)
On Wednesday, had low abd pain, wasn't severe but on Friday it became worse and couldn't sleep.  Low abd pain comes and goes, gets tight, and now it's into lower back. No bleeding. No leaking.  Baby moving well.

## 2017-01-31 LAB — CULTURE, OB URINE: SPECIAL REQUESTS: NORMAL

## 2017-02-25 ENCOUNTER — Ambulatory Visit (INDEPENDENT_AMBULATORY_CARE_PROVIDER_SITE_OTHER): Payer: 59 | Admitting: Family Medicine

## 2017-02-25 VITALS — BP 125/80 | HR 77 | Wt 264.0 lb

## 2017-02-25 DIAGNOSIS — Z3490 Encounter for supervision of normal pregnancy, unspecified, unspecified trimester: Secondary | ICD-10-CM | POA: Diagnosis not present

## 2017-02-25 DIAGNOSIS — R030 Elevated blood-pressure reading, without diagnosis of hypertension: Secondary | ICD-10-CM

## 2017-02-25 DIAGNOSIS — R772 Abnormality of alphafetoprotein: Secondary | ICD-10-CM

## 2017-02-25 DIAGNOSIS — Z3400 Encounter for supervision of normal first pregnancy, unspecified trimester: Secondary | ICD-10-CM

## 2017-02-25 DIAGNOSIS — Z3493 Encounter for supervision of normal pregnancy, unspecified, third trimester: Secondary | ICD-10-CM

## 2017-02-25 DIAGNOSIS — O3410 Maternal care for benign tumor of corpus uteri, unspecified trimester: Secondary | ICD-10-CM

## 2017-02-25 DIAGNOSIS — D259 Leiomyoma of uterus, unspecified: Secondary | ICD-10-CM

## 2017-02-25 NOTE — Progress Notes (Addendum)
   PRENATAL VISIT NOTE  Subjective:  Maureen Griffin is a 31 y.o. G1P0 at [redacted]w[redacted]d being seen today for ongoing prenatal care.  She is currently monitored for the following issues for this high-risk pregnancy and has Morbid obesity ([place]); Supervision of normal first pregnancy, antepartum; Chlamydia infection affecting pregnancy; Uterine fibroid during pregnancy, antepartum; and Elevated AFP on her problem list.  Patient reports no complaints.  Contractions: Not present. Vag. Bleeding: None.  Movement: Present. Denies leaking of fluid.   The following portions of the patient's history were reviewed and updated as appropriate: allergies, current medications, past family history, past medical history, past social history, past surgical history and problem list. Problem list updated.  Objective:   Vitals:   02/25/17 0831 02/25/17 0856  BP: (!) 142/79 125/80  Pulse: 79 77  Weight: 264 lb (119.7 kg)     Fetal Status: Fetal Heart Rate (bpm): 145 Fundal Height: 29 cm Movement: Present     General:  Alert, oriented and cooperative. Patient is in no acute distress.  Skin: Skin is warm and dry. No rash noted.   Cardiovascular: Normal heart rate noted  Respiratory: Normal respiratory effort, no problems with respiration noted  Abdomen: Soft, gravid, appropriate for gestational age.  Pain/Pressure: Present     Pelvic: Cervical exam deferred        Extremities: Normal range of motion.  Edema: None  Mental Status:  Normal mood and affect. Normal behavior. Normal judgment and thought content.   Assessment and Plan:  Pregnancy: G1P0 at [redacted]w[redacted]d  1. Prenatal care, antepartum - Glucose Tolerance, 2 Hours w/1 Hour - CBC - RPR - HIV antibody (with reflex)  2. Supervision of normal first pregnancy, antepartum FHT and Fh normal  3. Uterine fibroid during pregnancy, antepartum  4. Morbid obesity ([place])  5. Elevated AFP Normal [place] 6. Elevated BP Repeat normal - will watch closely.   Preterm labor  symptoms and general obstetric precautions including but not limited to vaginal bleeding, contractions, leaking of fluid and fetal movement were reviewed in detail with the patient. Please refer to After Visit Summary for other counseling recommendations.  Return in about 2 weeks (around 03/11/2017) for OB f/u.   Truett Mainland, DO

## 2017-02-25 NOTE — Progress Notes (Signed)
Patient doing two hour gtt today. Maureen Griffin RNBSN

## 2017-02-26 LAB — GLUCOSE TOLERANCE, 2 HOURS W/ 1HR
GLUCOSE, 1 HOUR: 87 mg/dL (ref 65–179)
GLUCOSE, FASTING: 83 mg/dL (ref 65–91)
Glucose, 2 hour: 93 mg/dL (ref 65–152)

## 2017-02-26 LAB — RPR: RPR Ser Ql: NONREACTIVE

## 2017-02-26 LAB — CBC
Hematocrit: 37.5 % (ref 34.0–46.6)
Hemoglobin: 12.7 g/dL (ref 11.1–15.9)
MCH: 29.1 pg (ref 26.6–33.0)
MCHC: 33.9 g/dL (ref 31.5–35.7)
MCV: 86 fL (ref 79–97)
PLATELETS: 289 10*3/uL (ref 150–379)
RBC: 4.36 x10E6/uL (ref 3.77–5.28)
RDW: 14.2 % (ref 12.3–15.4)
WBC: 11.4 10*3/uL — ABNORMAL HIGH (ref 3.4–10.8)

## 2017-02-26 LAB — HIV ANTIBODY (ROUTINE TESTING W REFLEX): HIV Screen 4th Generation wRfx: NONREACTIVE

## 2017-03-17 ENCOUNTER — Ambulatory Visit (INDEPENDENT_AMBULATORY_CARE_PROVIDER_SITE_OTHER): Payer: 59 | Admitting: Obstetrics & Gynecology

## 2017-03-17 ENCOUNTER — Encounter: Payer: 59 | Admitting: Obstetrics & Gynecology

## 2017-03-17 VITALS — BP 133/67 | HR 91 | Wt 271.0 lb

## 2017-03-17 DIAGNOSIS — O3413 Maternal care for benign tumor of corpus uteri, third trimester: Secondary | ICD-10-CM

## 2017-03-17 DIAGNOSIS — Z3403 Encounter for supervision of normal first pregnancy, third trimester: Secondary | ICD-10-CM

## 2017-03-17 DIAGNOSIS — R772 Abnormality of alphafetoprotein: Secondary | ICD-10-CM

## 2017-03-17 DIAGNOSIS — Z3400 Encounter for supervision of normal first pregnancy, unspecified trimester: Secondary | ICD-10-CM

## 2017-03-17 DIAGNOSIS — O98813 Other maternal infectious and parasitic diseases complicating pregnancy, third trimester: Secondary | ICD-10-CM

## 2017-03-17 DIAGNOSIS — A749 Chlamydial infection, unspecified: Secondary | ICD-10-CM

## 2017-03-17 DIAGNOSIS — O3410 Maternal care for benign tumor of corpus uteri, unspecified trimester: Secondary | ICD-10-CM

## 2017-03-17 DIAGNOSIS — D259 Leiomyoma of uterus, unspecified: Secondary | ICD-10-CM

## 2017-03-17 NOTE — Patient Instructions (Signed)
Uterine Fibroids Uterine fibroids are tissue masses (tumors) that can develop in the womb (uterus). They are also called leiomyomas. This type of tumor is not cancerous (benign) and does not spread to other parts of the body outside of the pelvic area, which is between the hip bones. Occasionally, fibroids may develop in the fallopian tubes, in the cervix, or on the support structures (ligaments) that surround the uterus. You can have one or many fibroids. Fibroids can vary in size, weight, and where they grow in the uterus. Some can become quite large. Most fibroids do not require medical treatment. What are the causes? A fibroid can develop when a single uterine cell keeps growing (replicating). Most cells in the human body have a control mechanism that keeps them from replicating without control. What are the signs or symptoms? Symptoms may include:  Heavy bleeding during your period.  Bleeding or spotting between periods.  Pelvic pain and pressure.  Bladder problems, such as needing to urinate more often (urinary frequency) or urgently.  Inability to reproduce offspring (infertility).  Miscarriages.  How is this diagnosed? Uterine fibroids are diagnosed through a physical exam. Your health care provider may feel the lumpy tumors during a pelvic exam. Ultrasonography and an MRI may be done to determine the size, location, and number of fibroids. How is this treated? Treatment may include:  Watchful waiting. This involves getting the fibroid checked by your health care provider to see if it grows or shrinks. Follow your health care provider's recommendations for how often to have this checked.  Hormone medicines. These can be taken by mouth or given through an intrauterine device (IUD).  Surgery. ? Removing the fibroids (myomectomy) or the uterus (hysterectomy). ? Removing blood supply to the fibroids (uterine artery embolization).  If fibroids interfere with your fertility and you  want to become pregnant, your health care provider may recommend having the fibroids removed. Follow these instructions at home:  Keep all follow-up visits as directed by your health care provider. This is important.  Take over-the-counter and prescription medicines only as told by your health care provider. ? If you were prescribed a hormone treatment, take the hormone medicines exactly as directed.  Ask your health care provider about taking iron pills and increasing the amount of dark green, leafy vegetables in your diet. These actions can help to boost your blood iron levels, which may be affected by heavy menstrual bleeding.  Pay close attention to your period and tell your health care provider about any changes, such as: ? Increased blood flow that requires you to use more pads or tampons than usual per month. ? A change in the number of days that your period lasts per month. ? A change in symptoms that are associated with your period, such as abdominal cramping or back pain. Contact a health care provider if:  You have pelvic pain, back pain, or abdominal cramps that cannot be controlled with medicines.  You have an increase in bleeding between and during periods.  You soak tampons or pads in a half hour or less.  You feel lightheaded, extra tired, or weak. Get help right away if:  You faint.  You have a sudden increase in pelvic pain. This information is not intended to replace advice given to you by your health care provider. Make sure you discuss any questions you have with your health care provider. Document Released: 06/19/2000 Document Revised: 02/20/2016 Document Reviewed: 12/19/2013 Elsevier Interactive Patient Education  2018 [employer] Inc.  

## 2017-03-17 NOTE — Progress Notes (Signed)
Patient complaining of lower abdominal pain since Monday afternoon. Kathrene Alu RNBSN

## 2017-03-17 NOTE — Progress Notes (Signed)
   PRENATAL VISIT NOTE  Subjective:  Maureen Griffin is a 31 y.o. G1P0 at 108w2d being seen today for ongoing prenatal care.  She is currently monitored for the following issues for this high-risk pregnancy and has Morbid obesity ([place]); Supervision of normal first pregnancy, antepartum; Chlamydia infection affecting pregnancy; Uterine fibroid during pregnancy, antepartum; and Elevated AFP on her problem list.  Patient reports pain in abd starting 1 week prev. The pain is over the uterus. NO ctx or blleding or LOF..  Contractions: Irritability.  .  Movement: Present. Denies leaking of fluid.   The following portions of the patient's history were reviewed and updated as appropriate: allergies, current medications, past family history, past medical history, past social history, past surgical history and problem list. Problem list updated.  Objective:   Vitals:   03/17/17 1343  BP: 133/67  Pulse: 91  Weight: 271 lb (122.9 kg)    Fetal Status: Fetal Heart Rate (bpm): 154   Movement: Present     General:  Alert, oriented and cooperative. Patient is in no acute distress.  Skin: Skin is warm and dry. No rash noted.   Cardiovascular: Normal heart rate noted  Respiratory: Normal respiratory effort, no problems with respiration noted  Abdomen: Soft, gravid, appropriate for gestational age.  Pain/Pressure: Present     Pelvic: Cervical exam deferred        Extremities: Normal range of motion.  Edema: Trace  Mental Status:  Normal mood and affect. Normal behavior. Normal judgment and thought content.   Assessment and Plan:  Pregnancy: G1P0 at [redacted]w[redacted]d  1. Supervision of normal first pregnancy, antepartum  2. Uterine fibroid during pregnancy, antepartum Pt starting to have pain over the fibroids   3. Morbid obesity ([place]) FH appropirate  4. Elevated AFP  5. Chlamydia infection affecting pregnancy in third trimester treated  Preterm labor symptoms and general obstetric precautions including  but not limited to vaginal bleeding, contractions, leaking of fluid and fetal movement were reviewed in detail with the patient. Please refer to After Visit Summary for other counseling recommendations.  Return in about 2 weeks (around 03/31/2017).   Lavonia Drafts, MD

## 2017-04-07 ENCOUNTER — Ambulatory Visit (INDEPENDENT_AMBULATORY_CARE_PROVIDER_SITE_OTHER): Payer: Self-pay | Admitting: Family Medicine

## 2017-04-07 VITALS — BP 130/62 | HR 65 | Wt 274.0 lb

## 2017-04-07 DIAGNOSIS — K219 Gastro-esophageal reflux disease without esophagitis: Secondary | ICD-10-CM

## 2017-04-07 DIAGNOSIS — Z3400 Encounter for supervision of normal first pregnancy, unspecified trimester: Secondary | ICD-10-CM

## 2017-04-07 MED ORDER — OMEPRAZOLE 20 MG PO CPDR: 20.0000 mg | DELAYED_RELEASE_CAPSULE | Freq: Every day | ORAL | 1 refills | Status: DC

## 2017-04-07 MED FILL — OMEPRAZOLE 20 MG CAP: 20 | 30 days supply | Qty: 30 | Fill #0

## 2017-04-07 NOTE — Patient Instructions (Signed)
Breastfeeding Deciding to breastfeed is one of the best choices you can make for you and your baby. A change in hormones during pregnancy causes your breast tissue to grow and increases the number and size of your milk ducts. These hormones also allow proteins, sugars, and fats from your blood supply to make breast milk in your milk-producing glands. Hormones prevent breast milk from being released before your baby is born as well as prompt milk flow after birth. Once breastfeeding has begun, thoughts of your baby, as well as his or her sucking or crying, can stimulate the release of milk from your milk-producing glands. Benefits of breastfeeding For Your Baby  Your first milk (colostrum) helps your baby's digestive system function better.  There are antibodies in your milk that help your baby fight off infections.  Your baby has a lower incidence of asthma, allergies, and sudden infant death syndrome.  The nutrients in breast milk are better for your baby than infant formulas and are designed uniquely for your baby's needs.  Breast milk improves your baby's brain development.  Your baby is less likely to develop other conditions, such as childhood obesity, asthma, or type 2 diabetes mellitus.  For You  Breastfeeding helps to create a very special bond between you and your baby.  Breastfeeding is convenient. Breast milk is always available at the correct temperature and costs nothing.  Breastfeeding helps to burn calories and helps you lose the weight gained during pregnancy.  Breastfeeding makes your uterus contract to its prepregnancy size faster and slows bleeding (lochia) after you give birth.  Breastfeeding helps to lower your risk of developing type 2 diabetes mellitus, osteoporosis, and breast or ovarian cancer later in life.  Signs that your baby is hungry Early Signs of Hunger  Increased alertness or activity.  Stretching.  Movement of the head from side to  side.  Movement of the head and opening of the mouth when the corner of the mouth or cheek is stroked (rooting).  Increased sucking sounds, smacking lips, cooing, sighing, or squeaking.  Hand-to-mouth movements.  Increased sucking of fingers or hands.  Late Signs of Hunger  Fussing.  Intermittent crying.  Extreme Signs of Hunger Signs of extreme hunger will require calming and consoling before your baby will be able to breastfeed successfully. Do not wait for the following signs of extreme hunger to occur before you initiate breastfeeding:  Restlessness.  A loud, strong cry.  Screaming.  Breastfeeding basics Breastfeeding Initiation  Find a comfortable place to sit or lie down, with your neck and back well supported.  Place a pillow or rolled up blanket under your baby to bring him or her to the level of your breast (if you are seated). Nursing pillows are specially designed to help support your arms and your baby while you breastfeed.  Make sure that your baby's abdomen is facing your abdomen.  Gently massage your breast. With your fingertips, massage from your chest wall toward your nipple in a circular motion. This encourages milk flow. You may need to continue this action during the feeding if your milk flows slowly.  Support your breast with 4 fingers underneath and your thumb above your nipple. Make sure your fingers are well away from your nipple and your baby's mouth.  Stroke your baby's lips gently with your finger or nipple.  When your baby's mouth is open wide enough, quickly bring your baby to your breast, placing your entire nipple and as much of the colored area   around your nipple (areola) as possible into your baby's mouth. ? More areola should be visible above your baby's upper lip than below the lower lip. ? Your baby's tongue should be between his or her lower gum and your breast.  Ensure that your baby's mouth is correctly positioned around your nipple  (latched). Your baby's lips should create a seal on your breast and be turned out (everted).  It is common for your baby to suck about 2-3 minutes in order to start the flow of breast milk.  Latching Teaching your baby how to latch on to your breast properly is very important. An improper latch can cause nipple pain and decreased milk supply for you and poor weight gain in your baby. Also, if your baby is not latched onto your nipple properly, he or she may swallow some air during feeding. This can make your baby fussy. Burping your baby when you switch breasts during the feeding can help to get rid of the air. However, teaching your baby to latch on properly is still the best way to prevent fussiness from swallowing air while breastfeeding. Signs that your baby has successfully latched on to your nipple:  Silent tugging or silent sucking, without causing you pain.  Swallowing heard between every 3-4 sucks.  Muscle movement above and in front of his or her ears while sucking.  Signs that your baby has not successfully latched on to nipple:  Sucking sounds or smacking sounds from your baby while breastfeeding.  Nipple pain.  If you think your baby has not latched on correctly, slip your finger into the corner of your baby's mouth to break the suction and place it between your baby's gums. Attempt breastfeeding initiation again. Signs of Successful Breastfeeding Signs from your baby:  A gradual decrease in the number of sucks or complete cessation of sucking.  Falling asleep.  Relaxation of his or her body.  Retention of a small amount of milk in his or her mouth.  Letting go of your breast by himself or herself.  Signs from you:  Breasts that have increased in firmness, weight, and size 1-3 hours after feeding.  Breasts that are softer immediately after breastfeeding.  Increased milk volume, as well as a change in milk consistency and color by the fifth day of  breastfeeding.  Nipples that are not sore, cracked, or bleeding.  Signs That Your Baby is Getting Enough Milk  Wetting at least 1-2 diapers during the first 24 hours after birth.  Wetting at least 5-6 diapers every 24 hours for the first week after birth. The urine should be clear or pale yellow by 5 days after birth.  Wetting 6-8 diapers every 24 hours as your baby continues to grow and develop.  At least 3 stools in a 24-hour period by age 5 days. The stool should be soft and yellow.  At least 3 stools in a 24-hour period by age 7 days. The stool should be seedy and yellow.  No loss of weight greater than 10% of birth weight during the first 3 days of age.  Average weight gain of 4-7 ounces (113-198 g) per week after age 4 days.  Consistent daily weight gain by age 5 days, without weight loss after the age of 2 weeks.  After a feeding, your baby may spit up a small amount. This is common. Breastfeeding frequency and duration Frequent feeding will help you make more milk and can prevent sore nipples and breast engorgement. Breastfeed when   you feel the need to reduce the fullness of your breasts or when your baby shows signs of hunger. This is called "breastfeeding on demand." Avoid introducing a pacifier to your baby while you are working to establish breastfeeding (the first 4-6 weeks after your baby is born). After this time you may choose to use a pacifier. Research has shown that pacifier use during the first year of a baby's life decreases the risk of sudden infant death syndrome (SIDS). Allow your baby to feed on each breast as long as he or she wants. Breastfeed until your baby is finished feeding. When your baby unlatches or falls asleep while feeding from the first breast, offer the second breast. Because newborns are often sleepy in the first few weeks of life, you may need to awaken your baby to get him or her to feed. Breastfeeding times will vary from baby to baby. However,  the following rules can serve as a guide to help you ensure that your baby is properly fed:  Newborns (babies 4 weeks of age or younger) may breastfeed every 1-3 hours.  Newborns should not go longer than 3 hours during the day or 5 hours during the night without breastfeeding.  You should breastfeed your baby a minimum of 8 times in a 24-hour period until you begin to introduce solid foods to your baby at around 6 months of age.  Breast milk pumping Pumping and storing breast milk allows you to ensure that your baby is exclusively fed your breast milk, even at times when you are unable to breastfeed. This is especially important if you are going back to work while you are still breastfeeding or when you are not able to be present during feedings. Your lactation consultant can give you guidelines on how long it is safe to store breast milk. A breast pump is a machine that allows you to pump milk from your breast into a sterile bottle. The pumped breast milk can then be stored in a refrigerator or freezer. Some breast pumps are operated by hand, while others use electricity. Ask your lactation consultant which type will work best for you. Breast pumps can be purchased, but some hospitals and breastfeeding support groups lease breast pumps on a monthly basis. A lactation consultant can teach you how to hand express breast milk, if you prefer not to use a pump. Caring for your breasts while you breastfeed Nipples can become dry, cracked, and sore while breastfeeding. The following recommendations can help keep your breasts moisturized and healthy:  Avoid using soap on your nipples.  Wear a supportive bra. Although not required, special nursing bras and tank tops are designed to allow access to your breasts for breastfeeding without taking off your entire bra or top. Avoid wearing underwire-style bras or extremely tight bras.  Air dry your nipples for 3-4minutes after each feeding.  Use only cotton  bra pads to absorb leaked breast milk. Leaking of breast milk between feedings is normal.  Use lanolin on your nipples after breastfeeding. Lanolin helps to maintain your skin's normal moisture barrier. If you use pure lanolin, you do not need to wash it off before feeding your baby again. Pure lanolin is not toxic to your baby. You may also hand express a few drops of breast milk and gently massage that milk into your nipples and allow the milk to air dry.  In the first few weeks after giving birth, some women experience extremely full breasts (engorgement). Engorgement can make your   breasts feel heavy, warm, and tender to the touch. Engorgement peaks within 3-5 days after you give birth. The following recommendations can help ease engorgement:  Completely empty your breasts while breastfeeding or pumping. You may want to start by applying warm, moist heat (in the shower or with warm water-soaked hand towels) just before feeding or pumping. This increases circulation and helps the milk flow. If your baby does not completely empty your breasts while breastfeeding, pump any extra milk after he or she is finished.  Wear a snug bra (nursing or regular) or tank top for 1-2 days to signal your body to slightly decrease milk production.  Apply ice packs to your breasts, unless this is too uncomfortable for you.  Make sure that your baby is latched on and positioned properly while breastfeeding.  If engorgement persists after 48 hours of following these recommendations, contact your health care provider or a lactation consultant. Overall health care recommendations while breastfeeding  Eat healthy foods. Alternate between meals and snacks, eating 3 of each per day. Because what you eat affects your breast milk, some of the foods may make your baby more irritable than usual. Avoid eating these foods if you are sure that they are negatively affecting your baby.  Drink milk, fruit juice, and water to  satisfy your thirst (about 10 glasses a day).  Rest often, relax, and continue to take your prenatal vitamins to prevent fatigue, stress, and anemia.  Continue breast self-awareness checks.  Avoid chewing and smoking tobacco. Chemicals from cigarettes that pass into breast milk and exposure to secondhand smoke may harm your baby.  Avoid alcohol and drug use, including marijuana. Some medicines that may be harmful to your baby can pass through breast milk. It is important to ask your health care provider before taking any medicine, including all over-the-counter and prescription medicine as well as vitamin and herbal supplements. It is possible to become pregnant while breastfeeding. If birth control is desired, ask your health care provider about options that will be safe for your baby. Contact a health care provider if:  You feel like you want to stop breastfeeding or have become frustrated with breastfeeding.  You have painful breasts or nipples.  Your nipples are cracked or bleeding.  Your breasts are red, tender, or warm.  You have a swollen area on either breast.  You have a fever or chills.  You have nausea or vomiting.  You have drainage other than breast milk from your nipples.  Your breasts do not become full before feedings by the fifth day after you give birth.  You feel sad and depressed.  Your baby is too sleepy to eat well.  Your baby is having trouble sleeping.  Your baby is wetting less than 3 diapers in a 24-hour period.  Your baby has less than 3 stools in a 24-hour period.  Your baby's skin or the white part of his or her eyes becomes yellow.  Your baby is not gaining weight by 5 days of age. Get help right away if:  Your baby is overly tired (lethargic) and does not want to wake up and feed.  Your baby develops an unexplained fever. This information is not intended to replace advice given to you by your health care provider. Make sure you discuss  any questions you have with your health care provider. Document Released: 06/22/2005 Document Revised: 12/04/2015 Document Reviewed: 12/14/2012 Elsevier Interactive Patient Education  2017 [employer]  

## 2017-04-07 NOTE — Progress Notes (Signed)
   PRENATAL VISIT NOTE  Subjective:  Maureen Griffin is a 31 y.o. G1P0 at [redacted]w[redacted]d being seen today for ongoing prenatal care.  She is currently monitored for the following issues for this low-risk pregnancy and has Morbid obesity ([place]); Supervision of normal first pregnancy, antepartum; Chlamydia infection affecting pregnancy; Uterine fibroid during pregnancy, antepartum; and Elevated AFP on her problem list.  Patient reports no complaints.  Contractions: Not present. Vag. Bleeding: None.  Movement: Present. Denies leaking of fluid.   The following portions of the patient's history were reviewed and updated as appropriate: allergies, current medications, past family history, past medical history, past social history, past surgical history and problem list. Problem list updated.  Objective:   Vitals:   04/07/17 1559 04/07/17 1627  BP: (!) 145/79 130/62  Pulse: 82 65  Weight: 274 lb (124.3 kg)     Fetal Status: Fetal Heart Rate (bpm): 158 Fundal Height: 34 cm Movement: Present     General:  Alert, oriented and cooperative. Patient is in no acute distress.  Skin: Skin is warm and dry. No rash noted.   Cardiovascular: Normal heart rate noted  Respiratory: Normal respiratory effort, no problems with respiration noted  Abdomen: Soft, gravid, appropriate for gestational age.  Pain/Pressure: Present     Pelvic: Cervical exam deferred        Extremities: Normal range of motion.  Edema: Trace  Mental Status:  Normal mood and affect. Normal behavior. Normal judgment and thought content.   Assessment and Plan:  Pregnancy: G1P0 at [redacted]w[redacted]d  1. Supervision of normal first pregnancy, antepartum Continue routine prenatal care.   2. Gastroesophageal reflux disease without esophagitis - omeprazole (PRILOSEC) 20 MG capsule; Take 1 capsule (20 mg total) by mouth daily.  Dispense: 30 capsule; Refill: 1  Preterm labor symptoms and general obstetric precautions including but not limited to vaginal  bleeding, contractions, leaking of fluid and fetal movement were reviewed in detail with the patient. Please refer to After Visit Summary for other counseling recommendations.  Return in 2 weeks (on 04/21/2017).   Donnamae Jude, MD

## 2017-04-21 ENCOUNTER — Ambulatory Visit (INDEPENDENT_AMBULATORY_CARE_PROVIDER_SITE_OTHER): Payer: 59 | Admitting: Obstetrics & Gynecology

## 2017-04-21 ENCOUNTER — Other Ambulatory Visit: Payer: Self-pay | Admitting: Obstetrics & Gynecology

## 2017-04-21 VITALS — BP 137/62 | HR 79 | Wt 296.0 lb

## 2017-04-21 DIAGNOSIS — A749 Chlamydial infection, unspecified: Secondary | ICD-10-CM

## 2017-04-21 DIAGNOSIS — Z113 Encounter for screening for infections with a predominantly sexual mode of transmission: Secondary | ICD-10-CM | POA: Diagnosis not present

## 2017-04-21 DIAGNOSIS — R772 Abnormality of alphafetoprotein: Secondary | ICD-10-CM

## 2017-04-21 DIAGNOSIS — O99213 Obesity complicating pregnancy, third trimester: Secondary | ICD-10-CM

## 2017-04-21 DIAGNOSIS — Z3400 Encounter for supervision of normal first pregnancy, unspecified trimester: Secondary | ICD-10-CM | POA: Diagnosis not present

## 2017-04-21 DIAGNOSIS — O3410 Maternal care for benign tumor of corpus uteri, unspecified trimester: Secondary | ICD-10-CM

## 2017-04-21 DIAGNOSIS — Z3403 Encounter for supervision of normal first pregnancy, third trimester: Secondary | ICD-10-CM

## 2017-04-21 DIAGNOSIS — O3413 Maternal care for benign tumor of corpus uteri, third trimester: Secondary | ICD-10-CM

## 2017-04-21 DIAGNOSIS — D259 Leiomyoma of uterus, unspecified: Secondary | ICD-10-CM

## 2017-04-21 DIAGNOSIS — O98813 Other maternal infectious and parasitic diseases complicating pregnancy, third trimester: Secondary | ICD-10-CM

## 2017-04-21 LAB — OB RESULTS CONSOLE GBS: GBS: NEGATIVE

## 2017-04-21 NOTE — Patient Instructions (Signed)
Natural Childbirth Natural childbirth is going through labor and delivery without any drugs to relieve pain. Additionally, fetal monitors are not used, a delivery is not done, and a surgical cut to enlarge the vaginal opening (episiotomy) is not made. With the help of a birthing professional (midwife or health care provider), you direct your own labor and delivery. Many women choose natural childbirth because it makes them feel more in control and in touch with their labor and delivery. Some woman also choose natural childbirth because they are concerned about medicines affecting them and their babies. Pregnant women with a high-risk pregnancy should not attempt natural childbirth. It is better to deliver the infant in a hospital if an emergency situation arises. Sometimes, a health care provider has to get involved for the health and safety of the mother and infant. Techniques for natural childbirth  The Lamaze method-This method teaches parents that having a baby is normal, healthy, and natural. It also teaches the mother to take a neutral position regarding pain medicine and numbing medicines and to make an informed decision about using these medicines when the time comes.  The [place] Fray (also called husband-coached birth)-This method teaches the father or partner to be the birth coach. It encourages the mother to exercise and eat a balanced, nutritious diet. It also involves relaxation and deep breathing exercises and preparing the parents for emergency situations. Methods of dealing with labor pain and delivery  Meditation.  Yoga.  Hypnosis.  Acupuncture.  Massage.  Changing positions (walking, rocking, showering, leaning on birth balls).  Lying in warm water or a whirlpool bath.  Finding an activity that keeps your mind off of the labor pain.  Listening to soft music.  Focusing on a particular object (visual imagery). Before going into labor  Be sure you and your spouse or  partner are in agreement about having a natural childbirth.  Decide if your health care provider or a midwife will deliver your baby.  Decide if you will have your baby in the hospital, at a birthing center, or at home.  If you have children, make plans to have someone take care of them when you go to the hospital or birthing center.  Know the distance and the time it takes to go to the hospital or birthing center. Practice going there and time it to be sure.  Have a bag packed with a nightgown, bathrobe, and toiletries. Be ready to take it with you when you go into labor.  Keep phone numbers of your family and friends handy if you need to call someone when you go into labor.  Your spouse or partner should go to all the natural childbirth technique classes.  Talk with your health care provider about the possibility of a medical emergency and what will happen if that occurs. Advantages of natural childbirth  You are in control of your labor and delivery.  You will not take medicines that could affect you and the baby.  There are no invasive procedures, such as an episiotomy.  You and your spouse or partner will work together, which can increase your bond with each other.  In most delivery centers, your family and friends can be involved in the labor and delivery process. Disadvantages of natural childbirth  You will experience pain during your labor and delivery.  The methods of helping relieve your labor pains may not work for you.  You may feel disappointed if you decide to change your mind during labor and not  have a natural childbirth. After the delivery  You will be very tired.  You will be uncomfortable because of your uterus contracting. You will feel soreness around the vagina.  You may feel cold and shaky. This is a natural reaction. This information is not intended to replace advice given to you by your health care provider. Make sure you discuss any questions you  have with your health care provider. Document Released: 06/04/2008 Document Revised: 11/28/2015 Document Reviewed: 02/27/2013 Elsevier Interactive Patient Education  2017 [employer].

## 2017-04-21 NOTE — Addendum Note (Signed)
Addended by: Lewie Loron D on: 04/21/2017 04:41 PM   Modules accepted: Orders

## 2017-04-21 NOTE — Progress Notes (Signed)
   PRENATAL VISIT NOTE  Subjective:  Maureen Griffin is a 31 y.o. G1P0 at [redacted]w[redacted]d being seen today for ongoing prenatal care.  She is currently monitored for the following issues for this high-risk pregnancy and has Morbid obesity ([place]); Supervision of normal first pregnancy, antepartum; Chlamydia infection affecting pregnancy; Uterine fibroid during pregnancy, antepartum; and Elevated AFP on her problem list.  Patient reports no complaints.  Contractions: Not present. Vag. Bleeding: None.  Movement: Present. Denies leaking of fluid.   The following portions of the patient's history were reviewed and updated as appropriate: allergies, current medications, past family history, past medical history, past social history, past surgical history and problem list. Problem list updated.  Objective:   Vitals:   04/21/17 1538  BP: 137/62  Pulse: 79  Weight: 296 lb (134.3 kg)    Fetal Status: Fetal Heart Rate (bpm): 154   Movement: Present     General:  Alert, oriented and cooperative. Patient is in no acute distress.  Skin: Skin is warm and dry. No rash noted.   Cardiovascular: Normal heart rate noted  Respiratory: Normal respiratory effort, no problems with respiration noted  Abdomen: Soft, gravid, appropriate for gestational age.  Pain/Pressure: Present     Pelvic: Cervical exam performed        Extremities: Normal range of motion.  Edema: None  Mental Status:  Normal mood and affect. Normal behavior. Normal judgment and thought content.   Assessment and Plan:  Pregnancy: G1P0 at [redacted]w[redacted]d  1. Supervision of normal first pregnancy, antepartum  2. Morbid obesity ([facility])  3. Elevated AFP  4. Uterine fibroid during pregnancy, antepartum  5. Chlamydia infection affecting pregnancy in third trimester GBS and cx done today  Preterm labor symptoms and general obstetric precautions including but not limited to vaginal bleeding, contractions, leaking of fluid and fetal movement were reviewed in  detail with the patient. Please refer to After Visit Summary for other counseling recommendations.  Return in about 1 week (around 04/28/2017).   Lavonia Drafts, MD

## 2017-04-23 LAB — URINE CYTOLOGY ANCILLARY ONLY
CHLAMYDIA, DNA PROBE: NEGATIVE
NEISSERIA GONORRHEA: NEGATIVE
Trichomonas: NEGATIVE

## 2017-04-26 LAB — STREP GP B NAA: Strep Gp B NAA: NEGATIVE

## 2017-04-28 ENCOUNTER — Ambulatory Visit (INDEPENDENT_AMBULATORY_CARE_PROVIDER_SITE_OTHER): Payer: 59 | Admitting: Obstetrics & Gynecology

## 2017-04-28 VITALS — BP 138/80 | HR 83 | Wt 294.0 lb

## 2017-04-28 DIAGNOSIS — Z3403 Encounter for supervision of normal first pregnancy, third trimester: Secondary | ICD-10-CM

## 2017-04-28 DIAGNOSIS — D259 Leiomyoma of uterus, unspecified: Secondary | ICD-10-CM

## 2017-04-28 DIAGNOSIS — O99213 Obesity complicating pregnancy, third trimester: Secondary | ICD-10-CM

## 2017-04-28 DIAGNOSIS — Z3400 Encounter for supervision of normal first pregnancy, unspecified trimester: Secondary | ICD-10-CM

## 2017-04-28 DIAGNOSIS — O3410 Maternal care for benign tumor of corpus uteri, unspecified trimester: Secondary | ICD-10-CM

## 2017-04-28 DIAGNOSIS — O3413 Maternal care for benign tumor of corpus uteri, third trimester: Secondary | ICD-10-CM

## 2017-04-28 DIAGNOSIS — R772 Abnormality of alphafetoprotein: Secondary | ICD-10-CM

## 2017-04-28 NOTE — Progress Notes (Addendum)
   PRENATAL VISIT NOTE  Subjective:  Maureen Griffin is a 31 y.o. G1P0 at [redacted]w[redacted]d being seen today for ongoing prenatal care.  She is currently monitored for the following issues for this high-risk pregnancy and has Morbid obesity ([place]); Supervision of normal first pregnancy, antepartum; Chlamydia infection affecting pregnancy; Uterine fibroid during pregnancy, antepartum; and Elevated AFP on her problem list.  Patient reports no complaints.  Contractions: Not present. Vag. Bleeding: None.  Movement: Present. Denies leaking of fluid.   The following portions of the patient's history were reviewed and updated as appropriate: allergies, current medications, past family history, past medical history, past social history, past surgical history and problem list. Problem list updated.  Objective:   Vitals:   04/28/17 1553  BP: (!) 150/80  Pulse: 72  Weight: 294 lb (133.4 kg)    Fetal Status:     Movement: Present     General:  Alert, oriented and cooperative. Patient is in no acute distress.  Skin: Skin is warm and dry. No rash noted.   Cardiovascular: Normal heart rate noted  Respiratory: Normal respiratory effort, no problems with respiration noted  Abdomen: Soft, gravid, appropriate for gestational age.  Pain/Pressure: Present     Pelvic: Cervical exam deferred        Extremities: Normal range of motion.  Edema: None  Mental Status:  Normal mood and affect. Normal behavior. Normal judgment and thought content.   Assessment and Plan:  Pregnancy: G1P0 at [redacted]w[redacted]d  1. Supervision of normal first pregnancy, antepartum Initial BP sl elevated. The repeat was WNL. Pt given precautions to f/u sooner than 1 week for HA, RUQ pain or visual changes  Reviewed pain management in labor  2. Uterine fibroid during pregnancy, antepartum  3. Morbid obesity ([facility])  4. Elevated AFP  Term labor symptoms and general obstetric precautions including but not limited to vaginal bleeding, contractions,  leaking of fluid and fetal movement were reviewed in detail with the patient. Please refer to After Visit Summary for other counseling recommendations.  Return in about 1 week (around 05/05/2017).   Lavonia Drafts, MD

## 2017-04-28 NOTE — Patient Instructions (Signed)

## 2017-05-02 ENCOUNTER — Encounter (HOSPITAL_COMMUNITY): Payer: Self-pay

## 2017-05-02 ENCOUNTER — Inpatient Hospital Stay (HOSPITAL_COMMUNITY)
Admission: AD | Admit: 2017-05-02 | Discharge: 2017-05-04 | DRG: 807 | Disposition: A | Discharge status: Home / Self Care | Payer: 59 | Source: Ambulatory Visit | Attending: Obstetrics and Gynecology | Admitting: Obstetrics and Gynecology

## 2017-05-02 ENCOUNTER — Inpatient Hospital Stay (HOSPITAL_COMMUNITY): Payer: 59 | Admitting: Anesthesiology

## 2017-05-02 DIAGNOSIS — O4202 Full-term premature rupture of membranes, onset of labor within 24 hours of rupture: Secondary | ICD-10-CM | POA: Diagnosis not present

## 2017-05-02 DIAGNOSIS — O3410 Maternal care for benign tumor of corpus uteri, unspecified trimester: Secondary | ICD-10-CM

## 2017-05-02 DIAGNOSIS — Z3400 Encounter for supervision of normal first pregnancy, unspecified trimester: Secondary | ICD-10-CM

## 2017-05-02 DIAGNOSIS — O3413 Maternal care for benign tumor of corpus uteri, third trimester: Secondary | ICD-10-CM | POA: Diagnosis not present

## 2017-05-02 DIAGNOSIS — O9962 Diseases of the digestive system complicating childbirth: Principal | ICD-10-CM | POA: Diagnosis present

## 2017-05-02 DIAGNOSIS — R772 Abnormality of alphafetoprotein: Secondary | ICD-10-CM

## 2017-05-02 DIAGNOSIS — Z3483 Encounter for supervision of other normal pregnancy, third trimester: Secondary | ICD-10-CM | POA: Diagnosis present

## 2017-05-02 DIAGNOSIS — O99214 Obesity complicating childbirth: Secondary | ICD-10-CM | POA: Diagnosis present

## 2017-05-02 DIAGNOSIS — K219 Gastro-esophageal reflux disease without esophagitis: Secondary | ICD-10-CM | POA: Diagnosis present

## 2017-05-02 DIAGNOSIS — O98813 Other maternal infectious and parasitic diseases complicating pregnancy, third trimester: Secondary | ICD-10-CM

## 2017-05-02 DIAGNOSIS — D259 Leiomyoma of uterus, unspecified: Secondary | ICD-10-CM | POA: Diagnosis present

## 2017-05-02 DIAGNOSIS — Z3A37 37 weeks gestation of pregnancy: Secondary | ICD-10-CM | POA: Diagnosis not present

## 2017-05-02 DIAGNOSIS — A749 Chlamydial infection, unspecified: Secondary | ICD-10-CM

## 2017-05-02 HISTORY — DX: Benign neoplasm of connective and other soft tissue, unspecified: D21.9

## 2017-05-02 LAB — CBC WITH DIFFERENTIAL/PLATELET
BASOS PCT: 0 %
Basophils Absolute: 0 10*3/uL (ref 0.0–0.1)
EOS ABS: 0 10*3/uL (ref 0.0–0.7)
EOS PCT: 0 %
HCT: 35.2 % — ABNORMAL LOW (ref 36.0–46.0)
HEMOGLOBIN: 12.7 g/dL (ref 12.0–15.0)
LYMPHS ABS: 2.9 10*3/uL (ref 0.7–4.0)
Lymphocytes Relative: 21 %
MCH: 30.3 pg (ref 26.0–34.0)
MCHC: 36.1 g/dL — ABNORMAL HIGH (ref 30.0–36.0)
MCV: 84 fL (ref 78.0–100.0)
Monocytes Absolute: 0.5 10*3/uL (ref 0.1–1.0)
Monocytes Relative: 4 %
NEUTROS PCT: 75 %
Neutro Abs: 10 10*3/uL — ABNORMAL HIGH (ref 1.7–7.7)
PLATELETS: 248 10*3/uL (ref 150–400)
RBC: 4.19 MIL/uL (ref 3.87–5.11)
RDW: 14 % (ref 11.5–15.5)
WBC: 13.4 10*3/uL — ABNORMAL HIGH (ref 4.0–10.5)

## 2017-05-02 LAB — COMPREHENSIVE METABOLIC PANEL
ALK PHOS: 306 U/L — AB (ref 38–126)
ALT: 23 U/L (ref 14–54)
AST: 29 U/L (ref 15–41)
Albumin: 3.1 g/dL — ABNORMAL LOW (ref 3.5–5.0)
Anion gap: 10 (ref 5–15)
BUN: 8 mg/dL (ref 6–20)
CO2: 23 mmol/L (ref 22–32)
CREATININE: 0.5 mg/dL (ref 0.44–1.00)
Calcium: 10.1 mg/dL (ref 8.9–10.3)
Chloride: 102 mmol/L (ref 101–111)
Glucose, Bld: 98 mg/dL (ref 65–99)
Potassium: 4.6 mmol/L (ref 3.5–5.1)
Sodium: 135 mmol/L (ref 135–145)
Total Bilirubin: 0.4 mg/dL (ref 0.3–1.2)
Total Protein: 6.8 g/dL (ref 6.5–8.1)

## 2017-05-02 LAB — PROTEIN / CREATININE RATIO, URINE
Creatinine, Urine: 16 mg/dL
Total Protein, Urine: <6 mg/dL

## 2017-05-02 LAB — CBC
HCT: 39.4 % (ref 36.0–46.0)
Hemoglobin: 14.3 g/dL (ref 12.0–15.0)
MCH: 30.4 pg (ref 26.0–34.0)
MCHC: 36.3 g/dL — ABNORMAL HIGH (ref 30.0–36.0)
MCV: 83.7 fL (ref 78.0–100.0)
PLATELETS: 293 10*3/uL (ref 150–400)
RBC: 4.71 MIL/uL (ref 3.87–5.11)
RDW: 13.8 % (ref 11.5–15.5)
WBC: 13.6 10*3/uL — AB (ref 4.0–10.5)

## 2017-05-02 LAB — TYPE AND SCREEN
ABO/RH(D): B POS
ANTIBODY SCREEN: NEGATIVE

## 2017-05-02 LAB — ABO/RH: ABO/RH(D): B POS

## 2017-05-02 LAB — RPR: RPR: NONREACTIVE

## 2017-05-02 MED ORDER — PHENYLEPHRINE 40 MCG/ML (10ML) SYRINGE FOR IV PUSH (FOR BLOOD PRESSURE SUPPORT)
PREFILLED_SYRINGE | INTRAVENOUS | Status: AC
  Administered 2017-05-02: 80 ug via INTRAVENOUS
  Filled 2017-05-02: qty 20

## 2017-05-02 MED ORDER — SODIUM CHLORIDE 0.9 % IV SOLN: 250.0000 mL | INTRAVENOUS | Status: DC | PRN

## 2017-05-02 MED ORDER — OXYCODONE-ACETAMINOPHEN 5-325 MG PO TABS: 2.0000 | ORAL_TABLET | ORAL | Status: DC | PRN

## 2017-05-02 MED ORDER — LIDOCAINE HCL (PF) 1 % IJ SOLN
30.0000 mL | INTRAMUSCULAR | Status: DC | PRN
  Filled 2017-05-02: qty 30

## 2017-05-02 MED ORDER — FENTANYL 2.5 MCG/ML BUPIVACAINE 1/10 % EPIDURAL INFUSION (WH - ANES)
14.0000 mL/h | INTRAMUSCULAR | Status: DC | PRN
  Administered 2017-05-02: 14 mL/h via EPIDURAL

## 2017-05-02 MED ORDER — BENZOCAINE-MENTHOL 20-0.5 % EX AERO: 1.0000 "application " | INHALATION_SPRAY | CUTANEOUS | Status: DC | PRN

## 2017-05-02 MED ORDER — LACTATED RINGERS IV SOLN: 500.0000 mL | INTRAVENOUS | Status: DC | PRN

## 2017-05-02 MED ORDER — TETANUS-DIPHTH-ACELL PERTUSSIS 5-2.5-18.5 LF-MCG/0.5 IM SUSP: 0.5000 mL | Freq: Once | INTRAMUSCULAR | Status: DC

## 2017-05-02 MED ORDER — LACTATED RINGERS IV SOLN
500.0000 mL | Freq: Once | INTRAVENOUS | Status: AC
  Administered 2017-05-02: 500 mL via INTRAVENOUS

## 2017-05-02 MED ORDER — ZOLPIDEM TARTRATE 5 MG PO TABS: 5.0000 mg | ORAL_TABLET | Freq: Every evening | ORAL | Status: DC | PRN

## 2017-05-02 MED ORDER — ONDANSETRON HCL 4 MG/2ML IJ SOLN: 4.0000 mg | Freq: Four times a day (QID) | INTRAMUSCULAR | Status: DC | PRN

## 2017-05-02 MED ORDER — DIPHENHYDRAMINE HCL 50 MG/ML IJ SOLN: 12.5000 mg | INTRAMUSCULAR | Status: DC | PRN

## 2017-05-02 MED ORDER — OXYTOCIN 40 UNITS IN LACTATED RINGERS INFUSION - SIMPLE MED
2.5000 [IU]/h | INTRAVENOUS | Status: DC
  Filled 2017-05-02: qty 1000

## 2017-05-02 MED ORDER — DIPHENHYDRAMINE HCL 25 MG PO CAPS: 25.0000 mg | ORAL_CAPSULE | Freq: Four times a day (QID) | ORAL | Status: DC | PRN

## 2017-05-02 MED ORDER — ACETAMINOPHEN 325 MG PO TABS: 650.0000 mg | ORAL_TABLET | ORAL | Status: DC | PRN

## 2017-05-02 MED ORDER — SODIUM CHLORIDE 0.9% FLUSH: 3.0000 mL | INTRAVENOUS | Status: DC | PRN

## 2017-05-02 MED ORDER — FENTANYL CITRATE (PF) 100 MCG/2ML IJ SOLN: 50.0000 ug | INTRAMUSCULAR | Status: DC | PRN

## 2017-05-02 MED ORDER — SIMETHICONE 80 MG PO CHEW: 80.0000 mg | CHEWABLE_TABLET | ORAL | Status: DC | PRN

## 2017-05-02 MED ORDER — OXYCODONE-ACETAMINOPHEN 5-325 MG PO TABS: 1.0000 | ORAL_TABLET | ORAL | Status: DC | PRN

## 2017-05-02 MED ORDER — DIBUCAINE 1 % RE OINT: 1.0000 "application " | TOPICAL_OINTMENT | RECTAL | Status: DC | PRN

## 2017-05-02 MED ORDER — SODIUM CHLORIDE 0.9% FLUSH: 3.0000 mL | Freq: Two times a day (BID) | INTRAVENOUS | Status: DC

## 2017-05-02 MED ORDER — EPHEDRINE 5 MG/ML INJ
INTRAVENOUS | Status: AC
  Administered 2017-05-02: 10 mg via INTRAVENOUS
  Filled 2017-05-02: qty 4

## 2017-05-02 MED ORDER — PRENATAL MULTIVITAMIN CH
1.0000 | ORAL_TABLET | Freq: Every day | ORAL | Status: DC
  Administered 2017-05-03 – 2017-05-04 (×2): 1 via ORAL
  Filled 2017-05-02 (×2): qty 1

## 2017-05-02 MED ORDER — PHENYLEPHRINE 40 MCG/ML (10ML) SYRINGE FOR IV PUSH (FOR BLOOD PRESSURE SUPPORT)
80.0000 ug | PREFILLED_SYRINGE | INTRAVENOUS | Status: DC | PRN
  Filled 2017-05-02: qty 5

## 2017-05-02 MED ORDER — PHENYLEPHRINE 40 MCG/ML (10ML) SYRINGE FOR IV PUSH (FOR BLOOD PRESSURE SUPPORT)
80.0000 ug | PREFILLED_SYRINGE | INTRAVENOUS | Status: DC | PRN
  Administered 2017-05-02 (×2): 80 ug via INTRAVENOUS
  Filled 2017-05-02: qty 10
  Filled 2017-05-02: qty 5

## 2017-05-02 MED ORDER — FLEET ENEMA 7-19 GM/118ML RE ENEM: 1.0000 | ENEMA | RECTAL | Status: DC | PRN

## 2017-05-02 MED ORDER — SOD CITRATE-CITRIC ACID 500-334 MG/5ML PO SOLN: 30.0000 mL | ORAL | Status: DC | PRN

## 2017-05-02 MED ORDER — ONDANSETRON HCL 4 MG PO TABS: 4.0000 mg | ORAL_TABLET | ORAL | Status: DC | PRN

## 2017-05-02 MED ORDER — IBUPROFEN 600 MG PO TABS
600.0000 mg | ORAL_TABLET | Freq: Four times a day (QID) | ORAL | Status: DC
  Administered 2017-05-02 – 2017-05-04 (×8): 600 mg via ORAL
  Filled 2017-05-02 (×8): qty 1

## 2017-05-02 MED ORDER — LACTATED RINGERS IV SOLN
500.0000 mL | INTRAVENOUS | Status: DC | PRN
  Administered 2017-05-02: 500 mL via INTRAVENOUS

## 2017-05-02 MED ORDER — OXYTOCIN BOLUS FROM INFUSION
500.0000 mL | Freq: Once | INTRAVENOUS | Status: AC
  Administered 2017-05-02: 500 mL via INTRAVENOUS

## 2017-05-02 MED ORDER — LACTATED RINGERS IV SOLN: INTRAVENOUS | Status: DC

## 2017-05-02 MED ORDER — WITCH HAZEL-GLYCERIN EX PADS: 1.0000 "application " | MEDICATED_PAD | CUTANEOUS | Status: DC | PRN

## 2017-05-02 MED ORDER — ONDANSETRON HCL 4 MG/2ML IJ SOLN: 4.0000 mg | INTRAMUSCULAR | Status: DC | PRN

## 2017-05-02 MED ORDER — MEASLES, MUMPS & RUBELLA VAC ~~LOC~~ INJ: 0.5000 mL | INJECTION | Freq: Once | SUBCUTANEOUS | Status: DC

## 2017-05-02 MED ORDER — TERBUTALINE SULFATE 1 MG/ML IJ SOLN
0.2500 mg | Freq: Once | INTRAMUSCULAR | Status: DC | PRN
  Filled 2017-05-02: qty 1

## 2017-05-02 MED ORDER — EPHEDRINE 5 MG/ML INJ
10.0000 mg | INTRAVENOUS | Status: AC | PRN
  Administered 2017-05-02 (×2): 10 mg via INTRAVENOUS
  Filled 2017-05-02: qty 4

## 2017-05-02 MED ORDER — COCONUT OIL OIL: 1.0000 "application " | TOPICAL_OIL | Status: DC | PRN

## 2017-05-02 MED ORDER — OXYTOCIN 40 UNITS IN LACTATED RINGERS INFUSION - SIMPLE MED
2.5000 [IU]/h | INTRAVENOUS | Status: DC
  Administered 2017-05-02: 2.5 [IU]/h via INTRAVENOUS

## 2017-05-02 MED ORDER — LIDOCAINE HCL (PF) 1 % IJ SOLN
INTRAMUSCULAR | Status: DC | PRN
  Administered 2017-05-02: 6 mL via EPIDURAL
  Administered 2017-05-02: 7 mL via EPIDURAL

## 2017-05-02 MED ORDER — SENNOSIDES-DOCUSATE SODIUM 8.6-50 MG PO TABS
2.0000 | ORAL_TABLET | ORAL | Status: DC
  Administered 2017-05-03 – 2017-05-04 (×2): 2 via ORAL
  Filled 2017-05-02 (×2): qty 2

## 2017-05-02 MED ORDER — OXYTOCIN BOLUS FROM INFUSION: 500.0000 mL | Freq: Once | INTRAVENOUS | Status: DC

## 2017-05-02 MED ORDER — LACTATED RINGERS IV SOLN
INTRAVENOUS | Status: DC
  Administered 2017-05-02: 14:00:00 via INTRAVENOUS

## 2017-05-02 MED ORDER — EPHEDRINE 5 MG/ML INJ
10.0000 mg | INTRAVENOUS | Status: DC | PRN
  Filled 2017-05-02: qty 4
  Filled 2017-05-02: qty 2
  Filled 2017-05-02: qty 4

## 2017-05-02 MED ORDER — FENTANYL 2.5 MCG/ML BUPIVACAINE 1/10 % EPIDURAL INFUSION (WH - ANES)
INTRAMUSCULAR | Status: AC
  Filled 2017-05-02: qty 100

## 2017-05-02 MED ORDER — OXYTOCIN 40 UNITS IN LACTATED RINGERS INFUSION - SIMPLE MED
1.0000 m[IU]/min | INTRAVENOUS | Status: DC
  Administered 2017-05-02: 2 m[IU]/min via INTRAVENOUS

## 2017-05-02 NOTE — MAU Note (Signed)
Contracting all day yesterday and stronger 7 pm. No bleeding. No leaking. Baby moving well.

## 2017-05-02 NOTE — Progress Notes (Signed)
Maureen Griffin is a 31 y.o. G1P0 at [redacted]w[redacted]d admitted for active labor  Subjective: Uncomfortable and breathing through ctx.  Feeling strong vaginal pressure during ctx. Pushed epidural button for relief.  Objective: BP (!) 109/50   Pulse 79   Temp 97.8 F (36.6 C) (Oral)   Resp 16   Ht 5\' 8"  (1.727 m)   Wt 133.4 kg (294 lb)   LMP 08/10/2016 (Exact Date) Comment: patient had positive home pregnancy test  SpO2 100%   BMI 44.70 kg/m  No intake/output data recorded. No intake/output data recorded.  FHT:  FHR: 140 bpm, variability: moderate,  accelerations:  Present,  decelerations:  Absent UC:   regular, every 2-4 minutes SVE:   Dilation: 8 Effacement (%): 90 Station: 0 Exam by:: cinthya bowmaker SCNM AROM @ 0272, small -mod amount clear fluid  Assessment / Plan: Augmentation of labor  Labor: Will begin pitocin to augment Fetal Wellbeing:  Category I Pain Control:  Epidural I/D:  n/a Anticipated MOD:  NSVD  Cinthya Bowmaker-Kareen, SNM 05/02/2017, 1:48 PM I assessed this pt and agree with the above assessment

## 2017-05-02 NOTE — Anesthesia Procedure Notes (Signed)
Epidural Patient location during procedure: OB Start time: 05/02/2017 9:34 AM End time: 05/02/2017 9:38 AM  Staffing Anesthesiologist: Lyn Hollingshead Performed: anesthesiologist   Preanesthetic Checklist Completed: patient identified, site marked, surgical consent, pre-op evaluation, timeout performed, IV checked, risks and benefits discussed and monitors and equipment checked  Epidural Patient position: sitting Prep: site prepped and draped and DuraPrep Patient monitoring: continuous pulse ox and blood pressure Approach: midline Location: L3-L4 Injection technique: LOR air  Needle:  Needle type: Tuohy  Needle gauge: 17 G Needle length: 9 cm and 9 Needle insertion depth: 9 cm Catheter type: closed end flexible Catheter size: 19 Gauge Catheter at skin depth: 15 cm Test dose: negative and Other  Assessment Events: blood not aspirated, injection not painful, no injection resistance, negative IV test and no paresthesia

## 2017-05-02 NOTE — Anesthesia Preprocedure Evaluation (Signed)
Anesthesia Evaluation  Patient identified by MRN, date of birth, ID band Patient awake    Reviewed: Allergy & Precautions, H&P , NPO status , Patient's Chart, lab work & pertinent test results  Airway Mallampati: II  TM Distance: >3 FB Neck ROM: full    Dental no notable dental hx. (+) Teeth Intact   Pulmonary neg pulmonary ROS,    Pulmonary exam normal breath sounds clear to auscultation       Cardiovascular negative cardio ROS Normal cardiovascular exam Rhythm:regular Rate:Normal     Neuro/Psych negative neurological ROS  negative psych ROS   GI/Hepatic negative GI ROS, Neg liver ROS,   Endo/Other  Morbid obesity  Renal/GU negative Renal ROS  negative genitourinary   Musculoskeletal negative musculoskeletal ROS (+)   Abdominal (+) + obese,   Peds  Hematology negative hematology ROS (+)   Anesthesia Other Findings   Reproductive/Obstetrics (+) Pregnancy                             Anesthesia Physical Anesthesia Plan  ASA: III  Anesthesia Plan: Epidural   Post-op Pain Management:    Induction:   PONV Risk Score and Plan:   Airway Management Planned:   Additional Equipment:   Intra-op Plan:   Post-operative Plan:   Informed Consent: I have reviewed the patients History and Physical, chart, labs and discussed the procedure including the risks, benefits and alternatives for the proposed anesthesia with the patient or authorized representative who has indicated his/her understanding and acceptance.       Plan Discussed with:   Anesthesia Plan Comments:         Anesthesia Quick Evaluation  

## 2017-05-02 NOTE — Progress Notes (Signed)
Maureen Griffin is a 31 y.o. G1P0 at [redacted]w[redacted]d by ultrasound admitted for active labor  Subjective:   Objective: BP (!) 98/46   Pulse 84   Temp 98.3 F (36.8 C) (Oral)   Resp 20   Ht 5\' 8"  (1.727 m)   Wt 294 lb (133.4 kg)   LMP 08/10/2016 (Exact Date) Comment: patient had positive home pregnancy test  SpO2 100%   BMI 44.70 kg/m  No intake/output data recorded. No intake/output data recorded.  FHT:  FHR: 150 bpm, variability: moderate,  accelerations:  Present,  decelerations:  Present pt had prolonged variable due to drop in blood pressure UC:   regular, every 4-5 minutes SVE:   Dilation: 6 Effacement (%): 90 Station: -1, -2 Exam by:: benji stanley RN  Labs: Lab Results  Component Value Date   WBC 13.6 (H) 05/02/2017   HGB 14.3 05/02/2017   HCT 39.4 05/02/2017   MCV 83.7 05/02/2017   PLT 293 05/02/2017    Assessment / Plan: Spontaneous labor, progressing normally  Labor: Progressing normally Preeclampsia:  no signs or symptoms of toxicity Fetal Wellbeing:  Category II Pain Control:  Epidural I/D:  n/a Anticipated MOD:  NSVD  [name] 05/02/2017, 10:09 AM

## 2017-05-02 NOTE — Anesthesia Pain Management Evaluation Note (Signed)
  CRNA Pain Management Visit Note  Patient: Maureen Griffin, 31 y.o., female  "Hello I am a member of the anesthesia team at [facility]. We have an anesthesia team available at all times to provide care throughout the hospital, including epidural management and anesthesia for C-section. I don't know your plan for the delivery whether it a natural birth, water birth, IV sedation, nitrous supplementation, doula or epidural, but we want to meet your pain goals."   1.Was your pain managed to your expectations on prior hospitalizations?   Yes   2.What is your expectation for pain management during this hospitalization?     Epidural  3.How can we help you reach that goal?   Record the patient's initial score and the patient's pain goal.   Pain: 8  Pain Goal: 0 The [facility] wants you to be able to say your pain was always managed very well.  Noma Quijas Viona Gilmore [employer] 05/02/2017

## 2017-05-02 NOTE — H&P (Signed)
OBSTETRIC ADMISSION HISTORY AND PHYSICAL  Maureen Griffin is a 31 y.o. female G1P0 with IUP at [redacted]w[redacted]d by LMP 08/10/16 presenting for SOL. She reports +FMs, No LOF, no VB, no blurry vision, headaches or peripheral edema, and RUQ pain.  She plans on breast feeding. She request birth control consult for birth control. No prior history of birth control. She received her prenatal care at [facility] and [facility]". Patient is being supervised for normal first pregnancy, Chlamydia infection affecting pregnancy, uterine fibroid pregnancy, and elevated AFP.  Patient has GERD without esophagitis but does affirm having to "spit up since the second month of her pregnancy".   Dating: By LMP (08/10/16) --->  Estimated Date of Delivery: 05/17/17  Sono:  01/29/17  @[redacted]w[redacted]d , CWD, normal anatomy, breech presentation, 731g, 56% EFW -Multiple Uterine Myomas 3 total. 2@ left fundus and 1 posterior: left fundus (4.4cm x 4.1cm x 3.1 cm),left fundus (5.4 cm x 7.2 cm x 6.5 cm), posterior (3.6 cm x 3.4 cm x 2.4 cm)    Prenatal History/Complications: -Chlamydia infection affecting pregnancy (pt treated) -Uterine fibroid during pregnancy -Elevated AFP -Morbid Obesity  Past Medical History: Past Medical History:  Diagnosis Date  . Fibroid   . Morbid obesity ([place]) 04/24/2016    Past Surgical History: Past Surgical History:  Procedure Laterality Date  . NO PAST SURGERIES      Obstetrical History: OB History    Gravida Para Term Preterm AB Living   1             SAB TAB Ectopic Multiple Live Births                  Social History: Social History   Social History  . Marital status: Married    Spouse name: N/A  . Number of children: N/A  . Years of education: N/A   Social History Main Topics  . Smoking status: Never Smoker  . Smokeless tobacco: Never Used  . Alcohol use No  . Drug use: No  . Sexual activity: Yes    Birth control/ protection: None   Other Topics Concern  . None    Social History Narrative  . None    Family History: Family History  Problem Relation Age of Onset  . Cancer Neg Hx   . Diabetes Neg Hx   . Hypertension Neg Hx   . Stroke Neg Hx     Allergies: No Known Allergies  Prescriptions Prior to Admission  Medication Sig Dispense Refill Last Dose  . Doxylamine-Pyridoxine 10-10 MG TBEC Take 1 tablet by mouth 2 (two) times daily at 8 am and 10 pm. 60 tablet 3 Past Week at Unknown time  . omeprazole (PRILOSEC) 20 MG capsule Take 1 capsule (20 mg total) by mouth daily. 30 capsule 1 Past Week at Unknown time  . Prenatal Vit-Fe Fumarate-FA (MULTIVITAMIN-PRENATAL) 27-0.8 MG TABS tablet Take 1 tablet by mouth daily at 12 noon.   05/01/2017 at Unknown time  . glycopyrrolate (ROBINUL) 1 MG tablet Take 1 tablet (1 mg total) by mouth 3 (three) times daily. (Patient not taking: Reported on 11/11/2016) 30 tablet 3 Not Taking     Review of Systems   All systems reviewed and negative except as stated in HPI  Blood pressure (!) 157/87, pulse 73, temperature 98.2 F (36.8 C), temperature source Oral, resp. rate 18, height 5\' 8"  (1.727 m), weight 294 lb (133.4 kg), last menstrual period 08/10/2016, SpO2 99 %. General appearance: alert, cooperative, no  distress, morbidly obese and having paroxysmal episodes of spitting up small volumes of reflux Lungs: clear to auscultation bilaterally Heart: regular rate and rhythm Abdomen: soft, non-tender; bowel sounds normal Extremities: Homans sign is negative, no sign of DVT Fetal monitoringBaseline: 150 bpm, Variability: Good {> 6 bpm) and Accelerations: Reactive Uterine activityFrequency: Every 6 minutes and Intensity: mild Dilation: 6 Effacement (%): 90 Station: -1, -2 Exam by:: benji stanley RN   Prenatal labs: ABO, Rh: B/Positive/-- (03/19 1114) Antibody: Negative (03/19 1114) Rubella: 6.61 (03/19 1114) RPR: Non Reactive (08/23 0902)  HBsAg: Negative (03/19 1114)  HIV:   Non Reactive (03/19  1114) GBS: Negative (10/17 0000)  2 hr Glucola Fasting 83, 1hr 87, 2hr 93 Genetic screening  1st screen: neg,  AFP: Abnormal/increased risk Anatomy US  normal  Prenatal Transfer Tool  Maternal Diabetes: No Genetic Screening: Normal Maternal Ultrasounds/Referrals: Normal Fetal Ultrasounds or other Referrals:  None Maternal Substance Abuse:  No Significant Maternal Medications:  None Significant Maternal Lab Results: Lab values include: Group B Strep negative  No results found for this or any previous visit (from the past 24 hour(s)).  Patient Active Problem List   Diagnosis Date Noted  . Normal labor 05/02/2017  . Indication for care in labor or delivery 05/02/2017  . Elevated AFP 12/29/2016  . Uterine fibroid during pregnancy, antepartum 11/11/2016  . Supervision of normal first pregnancy, antepartum 11/04/2016  . Chlamydia infection affecting pregnancy 11/04/2016  . Morbid obesity ([place]) 04/24/2016    Assessment/Plan:  Maureen Griffin is a 31 y.o. G1P0 at [redacted]w[redacted]d here for SOL  Admit to [facility] suite #Labor: Progressing well #Pain: Per request of patient (epidural) #FWB:  cat 1 #ID:  GBS neg #MOF: breast #MOC:undecided #Circ:  N/a (female) #Elevated Blood Pressures: Pt has had intermittent elevated blood pressures throughout her pregnancy starting at [redacted]wks gestation with range of 032-122 systolic/ 48-25 diastolic.  She does not have a history of Hypertension.  Ovidio Kin, MD  05/02/2017, 7:10 AM

## 2017-05-03 MED ORDER — IBUPROFEN 600 MG PO TABS: 600.0000 mg | ORAL_TABLET | Freq: Four times a day (QID) | ORAL | 0 refills | Status: DC

## 2017-05-03 NOTE — Plan of Care (Signed)
Problem: Education: Goal: Knowledge of condition will improve Baby not interested in feeding. Baby very sleepy and not showing cues; however, mother states baby has not had a good latch since delivery. Discussed with mother and demonstrated how to hand express breast milk. No milk noted upon hand expression. Discussed using hand pump prior to attempting to feed baby. Also discussed attempting to remove all blankets from around baby and do skin to skin to attempt to feed. Attempted to feed baby; however, baby showed no interest and would not open mouth to feed. Encouraged attempting to feed at least every three hours due to baby's weight and feed more often if baby showing cues.

## 2017-05-03 NOTE — Progress Notes (Signed)
Post Partum Day 1 Subjective: up ad lib, voiding, tolerating PO, + flatus and states she has some discomfort in RLQ that feels like a "pulling, not pain".    Patient inquired about when IV access will be removed.  Objective: Blood pressure 106/62, pulse 66, temperature 98.1 F (36.7 C), temperature source Oral, resp. rate 18, height 5\' 8"  (1.727 m), weight 133.4 kg (294 lb), last menstrual period 08/10/2016, SpO2 100 %, unknown if currently breastfeeding.  Physical Exam:  General: alert, cooperative, no distress and morbidly obese Lochia: appropriate Uterine Fundus: firm. Some mild tenderness to palpation on RLQ.  Incision: NA DVT Evaluation: No evidence of DVT seen on physical exam.   Recent Labs  05/02/17 0650 05/02/17 1530  HGB 14.3 12.7  HCT 39.4 35.2*    Assessment/Plan: Plan for discharge tomorrow, Breastfeeding and Contraception is going to be discussed later with father present. Patient states she does not want to have another baby within the next 2-3 years.   LOS: 1 day   [place] 05/03/2017, 7:54 AM

## 2017-05-03 NOTE — Lactation Note (Signed)
This note was copied from a baby's chart. Lactation Consultation Note  Patient Name: Maureen Griffin [id number] Date: 05/03/2017 Reason for consult: Initial assessment;Early term 37-38.6wks;Infant < 6lbs  Baby 80 hours old. Mom attempting to latch baby when this LC entered the room. However, baby wrapped in blanket and sleepy. Assisted mom to unwrap baby and enc STS. Performed suck training with this LC's gloved finger, and then demonstrated to mom how to support her breast and baby's head. Enc mom to sandwich her breast in order for baby to latch deeply. Baby latched deeply and suckled rhythmically off-and-on. Assisted mom with hand expression and mom had tiny amount of colostrum present. Mom wanting to exclusively BF, but willing to supplement with formula if needed. Enc mom to continue latching baby STS with cues and at least by 3 hours. Enc mom to supplement with EBM/formula according to guidelines--which were given with review, and then post-pump with DEBP. Discussed assessment and interventions with [name], RN.   Maternal Data Has patient been taught Hand Expression?: Yes Does the patient have breastfeeding experience prior to this delivery?: No  Feeding Feeding Type: Breast Fed Length of feed:  (Off-and-on.)  LATCH Score Latch: Repeated attempts needed to sustain latch, nipple held in mouth throughout feeding, stimulation needed to elicit sucking reflex.  Audible Swallowing: None  Type of Nipple: Everted at rest and after stimulation  Comfort (Breast/Nipple): Soft / non-tender  Hold (Positioning): Assistance needed to correctly position infant at breast and maintain latch.  LATCH Score: 6  Interventions Interventions: Breast feeding basics reviewed;Assisted with latch;Skin to skin;Hand express;Breast compression;Adjust position;Support pillows;Position options  Lactation Tools Discussed/Used     Consult Status      Andres Labrum 05/03/2017, 11:47 AM

## 2017-05-03 NOTE — Anesthesia Postprocedure Evaluation (Signed)
Anesthesia Post Note  Patient: Maureen Griffin  Procedure(s) Performed: AN AD HOC LABOR EPIDURAL     Patient location during evaluation: Mother Baby Anesthesia Type: Epidural Level of consciousness: awake and alert and oriented Pain management: pain level controlled Vital Signs Assessment: post-procedure vital signs reviewed and stable Respiratory status: spontaneous breathing and nonlabored ventilation Cardiovascular status: stable Postop Assessment: no headache, no apparent nausea or vomiting, no backache, adequate PO intake, epidural receding and patient able to bend at knees Anesthetic complications: no    Last Vitals:  Vitals:   05/02/17 2220 05/03/17 0600  BP: 118/73 106/62  Pulse: 82 66  Resp: 18 18  Temp: 36.8 C 36.7 C  SpO2:  100%    Last Pain:  Vitals:   05/03/17 0600  TempSrc: Oral  PainSc: 3    Pain Goal: Patients Stated Pain Goal: 0 (05/02/17 0617)               Jabier Mutton

## 2017-05-03 NOTE — Discharge Summary (Signed)
OB Discharge Summary     Patient Name: Maureen Griffin DOB: Jun 02, 1986 MRN: [id number] Date of admission: 05/02/2017  Delivering MD: Luretha Murphy )  Date of discharge: 05/04/2017    Admitting diagnosis: active labor Intrauterine pregnancy: [redacted]w[redacted]d    Secondary diagnosis:  Active Problems:   Patient Active Problem List   Diagnosis Date Noted  . Normal labor 05/02/2017  . Indication for care in labor or delivery 05/02/2017  . Elevated AFP 12/29/2016  . Uterine fibroid during pregnancy, antepartum 11/11/2016  . Supervision of normal first pregnancy, antepartum 11/04/2016  . Chlamydia infection affecting pregnancy 11/04/2016  . Morbid obesity ([place]) 04/24/2016    Additional problems: none     Discharge diagnosis: Term Pregnancy Delivered                                                                                                Post partum procedures:none  Complications: None  Hospital course:  Onset of Labor With Vaginal Delivery     31 y.o. yo G1P1001 at [redacted]w[redacted]d was admitted in Active Labor on 05/02/2017. Patient had an uncomplicated labor course as follows:  Membrane Rupture Time/Date: 11:05 AM ,05/02/2017   Intrapartum Procedures: Episiotomy: None [1]                                         Lacerations:  None [1]  Patient had a delivery of a Viable infant. 05/02/2017  Information for the patient's newborn:  Kellyann, [place] [[id number]]  Delivery Method: Vaginal, Spontaneous Delivery (Filed from Delivery Summary)    Pateint had an uncomplicated postpartum course.  She is ambulating, tolerating a regular diet, passing flatus, and urinating well. Patient is discharged home in stable condition on 05/04/17.   Physical exam  Vitals:   05/03/17 0600 05/03/17 1904  BP: 106/62 119/75  Pulse: 66 84  Resp: 18 20  Temp: 98.1 F (36.7 C) 98.6 F (37 C)  SpO2: 100%     General: alert, cooperative and no distress Lochia: appropriate Uterine Fundus:  firm Incision: N/A DVT Evaluation: No evidence of DVT seen on physical exam.  Labs: No results found for this or any previous visit (from the past 24 hour(s)).   Discharge instruction: per After Visit Summary and "Baby and Me Booklet".  After visit meds:  No Known Allergies  Allergies as of 05/04/2017   No Known Allergies     Medication List    STOP taking these medications   Doxylamine-Pyridoxine 10-10 MG Tbec   glycopyrrolate 1 MG tablet Commonly known as:  ROBINUL   omeprazole 20 MG capsule Commonly known as:  PRILOSEC   ranitidine 150 MG tablet Commonly known as:  ZANTAC     TAKE these medications   ibuprofen 600 MG tablet Commonly known as:  ADVIL,MOTRIN Take 1 tablet (600 mg total) by mouth every 6 (six) hours.   prenatal multivitamin Tabs tablet Take 1 tablet by mouth daily at 12 noon.        Diet: routine  diet  Activity: Advance as tolerated. Pelvic rest for 6 weeks.   Outpatient follow up:4 weeks Future Appointments:  Future Appointments Date Time Provider [place]  05/05/2017 3:45 PM Lavonia Drafts, MD [facility] None  05/24/2017 8:30 AM [place], Crosby Oyster, DO [id number] None    Follow up Appt: No Follow-up on file.     Postpartum contraception: Undecided  Newborn Data: APGAR (1 MIN): 9   APGAR (5 MINS): 9      Baby Feeding: Breast Disposition:home with mother  Dannielle Huh, DO  05/04/2017

## 2017-05-04 MED FILL — IBUPROFEN 600 MG TABLET: 600 | 7 days supply | Qty: 30 | Fill #0

## 2017-05-04 NOTE — Lactation Note (Signed)
This note was copied from a baby's chart. Lactation Consultation Note  Patient Name: Girl Vadie Principato [id number] Date: 05/04/2017 Reason for consult: Follow-up assessment  Baby 60 hours old. Mom reports that she attempted to nurse baby earlier, but baby would not latch, so baby given 20 ml of formula. Mom states that she did not use DEBP at that feeding, but has been pumping and seeing a small amount of EBM--drops to 48ml. Enc mom to keep putting baby to breast with cues and at least by 3 hours. Enc FOB to supplement baby according to guidelines--which parents have at bedside, while mom post-pumps. Discussed progression of milk coming to volume and supply and demand. Enc mom to call for assistance with latching baby at next feeding.  Maternal Data    Feeding Feeding Type: Formula  LATCH Score                   Interventions    Lactation Tools Discussed/Used     Consult Status Consult Status: Follow-up Date: 05/05/17 Follow-up type: In-patient    Andres Labrum 05/04/2017, 9:45 AM

## 2017-05-04 NOTE — Lactation Note (Signed)
This note was copied from a baby's chart. Lactation Consultation Note  Patient Name: Maureen Griffin SAYTK'Z Date: 05/04/2017 Reason for consult: Follow-up assessment;Early term 37-38.6wks;Infant < 6lbs  Baby 70 hours old. Assisted mom with positioning and latching baby in football position to right breast. Baby very fussy at breast, but would suckle this LC's gloved finger. Baby latched briefly several times, but would not suckle more that a few times. Assisted mom with hand expression with only a slight amount of moisture at right breast--however, mom able to return-demonstrate hand expression. Assisted parents to supplement baby 24 ml of Alimentum with FOB's finger and curve-tipped syringe, and baby tolerated well. Enc mom to keep putting baby to breast with cues and at least every 3 hours. Enc parents to supplement with EBM/formula according to guidelines, which were reviewed. Enc mom to post-pump after each feeding followed by hand expression. Mom aware of OP/BFSG and [place] phone line assistance after D/C.   Mom a [employer] employee, so given UMR pump.   Maternal Data    Feeding Feeding Type: Breast Fed Length of feed: 0 min  LATCH Score Latch: Repeated attempts needed to sustain latch, nipple held in mouth throughout feeding, stimulation needed to elicit sucking reflex.  Audible Swallowing: None  Type of Nipple: Everted at rest and after stimulation  Comfort (Breast/Nipple): Soft / non-tender  Hold (Positioning): Assistance needed to correctly position infant at breast and maintain latch.  LATCH Score: 6  Interventions Interventions: Breast feeding basics reviewed;Assisted with latch;Skin to skin;Hand express;Breast compression;Adjust position;Support pillows;Position options  Lactation Tools Discussed/Used     Consult Status Consult Status: PRN Date: 05/05/17 Follow-up type: In-patient    Andres Labrum 05/04/2017, 11:04 AM

## 2017-05-05 ENCOUNTER — Ambulatory Visit: Payer: Self-pay

## 2017-05-05 ENCOUNTER — Encounter: Payer: 59 | Admitting: Obstetrics & Gynecology

## 2017-05-05 NOTE — Lactation Note (Signed)
This note was copied from a baby's chart. Lactation Consultation Note: Mother reports that infant just fed for 10 mins. Father of baby gave infant 20 ml of EBM with finger and curved tip syringe. Mother advised to continue to cue base feed and feed infant at least 8-12 times in 24 hours.  Mother reports that her breast are filling and swollen.  Advised mother to continue to post pump for 15-20 mins until milk comes to volume. Mother to continue to supplement infant after each feeding. Advised mother to continue to increase amts of ebm every 2-3 hours. Mother was offered a follow up [facility] visit. Mother reports that she will call and schedule appt later. Mother has phone number for [facility] OP dept. Advised mother to keep accurate account of wet and dirty diapers. Mother advised to do good breast massage to soften breast tissue. Advised to breast feed infant and then post pump. Mother advised that she may use ice for 15 mins every 3-4 hours. Mother receptive to all teaching. Mother is aware of all available [place] resources . 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 She received her PIS.   Patient Name: Maureen Griffin [id number] Date: 05/05/2017 Reason for consult: Follow-up assessment   Maternal Data    Feeding Feeding Type: Breast Milk Length of feed: 10 min (per mother)  LATCH Score                   Interventions    Lactation Tools Discussed/Used     Consult Status Consult Status: Complete    Darla Lesches 05/05/2017, 9:48 AM

## 2017-05-05 NOTE — Lactation Note (Signed)
This note was copied from a baby's chart. Lactation Consultation Note: Paged back to mothers room to check latch. Infant bouncing on and off with no sustained latch. Observed that infant has a short anterior frenula and a high palate.  Mother was fit with a #20, and #24 nipple shield. Infant latched on the #24 nipple shield with lips widely gaped. Infant suckled a few sucks. 2 ml of ebm given at breast with a curved tip syringe.  Advised mother to post pump after each feeding. Discussed using a wide base bottle nipple when she gets home. Mother advised in proper application of the nipple shield. Advised mother to continue to post pump and continue to supplement infant. Advised again to follow up with [facility] outpatient dept. Mother receptive to all teaching. Mother advised to follow up with a Specialist to evaluated infants tongue for mobility.   Patient Name: Maureen Griffin [id number] Date: 05/05/2017 Reason for consult: Follow-up assessment   Maternal Data    Feeding Feeding Type: Breast Fed Length of feed:  (few sucks)  LATCH Score Latch: Grasps breast easily, tongue down, lips flanged, rhythmical sucking.  Audible Swallowing: None  Type of Nipple: Everted at rest and after stimulation  Comfort (Breast/Nipple): Filling, red/small blisters or bruises, mild/mod discomfort (breast are filling)  Hold (Positioning): Assistance needed to correctly position infant at breast and maintain latch.  LATCH Score: 6  Interventions    Lactation Tools Discussed/Used Tools: Nipple Shields Nipple shield size: 20;24   Consult Status Consult Status: Follow-up Follow-up type: Out-patient    Jess Barters [facility] 05/05/2017, 10:52 AM

## 2017-05-24 ENCOUNTER — Telehealth: Payer: Self-pay | Admitting: Family Medicine

## 2017-05-24 ENCOUNTER — Encounter: Payer: 59 | Admitting: Family Medicine

## 2017-05-24 NOTE — Telephone Encounter (Signed)
As per PCP no charge for patient 8:30am physical scheduled for today  05/24/17.

## 2017-06-03 ENCOUNTER — Ambulatory Visit: Payer: 59 | Admitting: Family Medicine

## 2017-06-03 ENCOUNTER — Ambulatory Visit (INDEPENDENT_AMBULATORY_CARE_PROVIDER_SITE_OTHER): Payer: 59 | Admitting: Family Medicine

## 2017-06-03 ENCOUNTER — Encounter: Payer: Self-pay | Admitting: Family Medicine

## 2017-06-03 VITALS — BP 120/80 | HR 75 | Temp 98.7°F | Ht 68.0 in | Wt 272.1 lb

## 2017-06-03 DIAGNOSIS — Z0000 Encounter for general adult medical examination without abnormal findings: Secondary | ICD-10-CM

## 2017-06-03 LAB — COMPREHENSIVE METABOLIC PANEL
ALT: 42 U/L — AB (ref 0–35)
AST: 30 U/L (ref 0–37)
Albumin: 3.9 g/dL (ref 3.5–5.2)
Alkaline Phosphatase: 87 U/L (ref 39–117)
BILIRUBIN TOTAL: 0.5 mg/dL (ref 0.2–1.2)
BUN: 11 mg/dL (ref 6–23)
CALCIUM: 9 mg/dL (ref 8.4–10.5)
CO2: 28 meq/L (ref 19–32)
CREATININE: 0.8 mg/dL (ref 0.40–1.20)
Chloride: 105 mEq/L (ref 96–112)
GFR: 107.67 mL/min (ref >60.00)
GLUCOSE: 98 mg/dL (ref 70–99)
Potassium: 3.9 mEq/L (ref 3.5–5.1)
Sodium: 138 mEq/L (ref 135–145)
Total Protein: 7.3 g/dL (ref 6.0–8.3)

## 2017-06-03 LAB — LIPID PANEL
CHOL/HDL RATIO: 3
Cholesterol: 174 mg/dL (ref 0–200)
HDL: 62.7 mg/dL (ref >39.00)
LDL Cholesterol: 103 mg/dL — ABNORMAL HIGH (ref 0–99)
NonHDL: 110.9
TRIGLYCERIDES: 42 mg/dL (ref 0.0–149.0)
VLDL: 8.4 mg/dL (ref 0.0–40.0)

## 2017-06-03 NOTE — Progress Notes (Signed)
Chief Complaint  Patient presents with  . Annual Exam     Well Woman Maureen Griffin is here for a complete physical.   Her last physical was >1 year ago.  Current diet: in general, a "healthy" diet  . Current exercise: Goes to [employer]. Weight is stable and she denies daytime fatigue. Patient's last menstrual period was 08/10/2016 (exact date). Seatbelt? Yes  Health Maintenance Pap/HPV- Yes Tetanus- Yes HIV screening- Yes  Past Medical History:  Diagnosis Date  . Fibroid   . Morbid obesity ([place]) 04/24/2016     Past Surgical History:  Procedure Laterality Date  . NO PAST SURGERIES      Medications  Current Outpatient Medications on File Prior to Visit  Medication Sig Dispense Refill  . ibuprofen (ADVIL,MOTRIN) 600 MG tablet Take 1 tablet (600 mg total) by mouth every 6 (six) hours. 30 tablet 0  . Prenatal Vit-Fe Fumarate-FA (PRENATAL MULTIVITAMIN) TABS tablet Take 1 tablet by mouth daily at 12 noon.     Allergies No Known Allergies  Review of Systems: Constitutional:  no fevers Eye:  no recent significant change in vision Ear/Nose/Mouth/Throat:  Ears:  no tinnitus or vertigo and no recent change in hearing, Nose/Mouth/Throat:  no complaints of nasal congestion, no sore throat Cardiovascular: no chest pain, no palpitations Respiratory:  no cough and no shortness of breath Gastrointestinal:  no abdominal pain, no change in bowel habits, no significant change in appetite, no nausea, vomiting, diarrhea, or constipation and no black or bloody stool GU:  Female: negative for dysuria, frequency, and incontinence; no abnormal bleeding, pelvic pain, or discharge Musculoskeletal/Extremities:  no pain of the joints Integumentary (Skin/Breast):  no abnormal skin lesions reported Neurologic:  no headaches Endocrine:  denies fatigue Hematologic/Lymphatic:  no unexpected weight changes  Exam BP 120/80 (BP Location: Left Arm, Patient Position: Sitting, Cuff Size: Large)    Pulse 75   Temp 98.7 F (37.1 C) (Oral)   Ht 5\' 8"  (1.727 m)   Wt 272 lb 2 oz (123.4 kg)   LMP 08/10/2016 (Exact Date) Comment: patient had positive home pregnancy test  SpO2 95%   BMI 41.38 kg/m  General:  well developed, well nourished, in no apparent distress Skin:  no significant moles, warts, or growths Head:  no masses, lesions, or tenderness Eyes:  pupils equal and round, sclera anicteric without injection Ears:  canals without lesions, TMs shiny without retraction, no obvious effusion, no erythema Nose:  nares patent, septum midline, mucosa normal, and no drainage or sinus tenderness Throat/Pharynx:  lips and gingiva without lesion; tongue and uvula midline; non-inflamed pharynx; no exudates or postnasal drainage Neck: neck supple without adenopathy, thyromegaly, or masses Breasts:  Not done Thorax:  nontender Lungs:  clear to auscultation, breath sounds equal bilaterally, no respiratory distress Cardio:  regular rate and rhythm without murmurs, heart sounds without clicks or rubs, point of maximal impulse normal; no lifts, heaves, or thrills Abdomen:  abdomen soft, nontender; bowel sounds normal; no masses or organomegaly Genital: Defer to GYN Musculoskeletal:  symmetrical muscle groups noted without atrophy or deformity Extremities:  no clubbing, cyanosis, or edema, no deformities, no skin discoloration Neuro:  gait normal; deep tendon reflexes normal and symmetric Psych: well oriented with normal range of affect and appropriate judgment/insight  Assessment and Plan  Well adult exam - Plan: Lipid panel, Comprehensive metabolic panel   Well 31 y.o. female. Counseled on diet and exercise. Other orders as above. Follow up 1 yr. The patient voiced understanding  and agreement to the plan.  [place], DO 06/03/17 8:59 AM

## 2017-06-03 NOTE — Progress Notes (Signed)
Pre visit review using our clinic review tool, if applicable. No additional management support is needed unless otherwise documented below in the visit note. 

## 2017-06-03 NOTE — Patient Instructions (Signed)
Good luck with the little one!  Good seeing you today.  Let us know if you need anything.

## 2017-06-04 ENCOUNTER — Other Ambulatory Visit: Payer: Self-pay | Admitting: Family Medicine

## 2017-06-04 DIAGNOSIS — R7401 Elevation of levels of liver transaminase levels: Secondary | ICD-10-CM

## 2017-06-04 DIAGNOSIS — R740 Nonspecific elevation of levels of transaminase and lactic acid dehydrogenase [LDH]: Principal | ICD-10-CM

## 2017-06-09 ENCOUNTER — Ambulatory Visit (INDEPENDENT_AMBULATORY_CARE_PROVIDER_SITE_OTHER): Payer: 59 | Admitting: Obstetrics and Gynecology

## 2017-06-09 ENCOUNTER — Other Ambulatory Visit: Payer: 59

## 2017-06-09 ENCOUNTER — Other Ambulatory Visit (INDEPENDENT_AMBULATORY_CARE_PROVIDER_SITE_OTHER): Payer: 59

## 2017-06-09 ENCOUNTER — Encounter: Payer: Self-pay | Admitting: Obstetrics and Gynecology

## 2017-06-09 DIAGNOSIS — R7401 Elevation of levels of liver transaminase levels: Secondary | ICD-10-CM

## 2017-06-09 DIAGNOSIS — R740 Nonspecific elevation of levels of transaminase and lactic acid dehydrogenase [LDH]: Secondary | ICD-10-CM | POA: Diagnosis not present

## 2017-06-09 NOTE — Progress Notes (Signed)
Post Partum Exam  Maureen Griffin is a 31 y.o. G18P1001 female who presents for a postpartum visit. She is 5 weeks postpartum following a spontaneous vaginal delivery. I have fully reviewed the prenatal and intrapartum course. The delivery was at 12 -6gestational weeks.  Anesthesia: epidural. Postpartum course has been uneventful. Baby's course has been unevntful. Baby is feeding by breast. Bleeding thin lochia. Bowel function is normal. Bladder function is normal. Patient is not sexually active. Contraception method is  -- Postpartum depression screening:neg (score 1)  The following portions of the patient's history were reviewed and updated as appropriate: allergies, current medications, past family history, past medical history, past social history, past surgical history and problem list.  Review of Systems Pertinent items are noted in HPI.    Objective:  Last menstrual period 08/10/2016, unknown if currently breastfeeding.  General:  alert, cooperative, appears stated age and no distress   Breasts:  deferred  Lungs: Normal effort of breathing  Heart:  Normal rate noted  Abdomen: soft, non-tender   Vulva:  not evaluated  Vagina: not evaluated  Cervix:  not evaluated  Corpus: not examined  Adnexa:  not evaluated  Rectal Exam: Not performed.        Assessment:    Normal postpartum exam. Counseled regarding contraception.   Plan:   1. Contraception: none Reviewed options for birth control including oral contraceptive pills (combination and progesterone only), NuvaRing, Depo-Provera, Nexplanon, IUDs (copper and levonorgestrol). Thoroughly reviewed risks/benefits/side effects of each. Answered all questions. Patient with h/o of nothing and opts for nexplanon but wants to discuss with husband first. She will return for placement.  2. Negative pap 09/2016 3. Positive chlamydia 1st trim, repeat negative 3. Follow up in:  as needed.    Feliz Beam, M.D. Attending [place], [facility] for [employer], [place]

## 2017-06-10 ENCOUNTER — Other Ambulatory Visit: Payer: Self-pay | Admitting: Family Medicine

## 2017-06-10 DIAGNOSIS — R740 Nonspecific elevation of levels of transaminase and lactic acid dehydrogenase [LDH]: Principal | ICD-10-CM

## 2017-06-10 DIAGNOSIS — R7401 Elevation of levels of liver transaminase levels: Secondary | ICD-10-CM

## 2017-06-10 LAB — HEPATIC FUNCTION PANEL
ALT: 40 U/L — ABNORMAL HIGH (ref 0–35)
AST: 31 U/L (ref 0–37)
Albumin: 4.1 g/dL (ref 3.5–5.2)
Alkaline Phosphatase: 86 U/L (ref 39–117)
BILIRUBIN DIRECT: 0.1 mg/dL (ref 0.0–0.3)
BILIRUBIN TOTAL: 0.5 mg/dL (ref 0.2–1.2)
Total Protein: 7 g/dL (ref 6.0–8.3)

## 2017-06-11 ENCOUNTER — Other Ambulatory Visit (INDEPENDENT_AMBULATORY_CARE_PROVIDER_SITE_OTHER): Payer: 59

## 2017-06-11 DIAGNOSIS — R740 Nonspecific elevation of levels of transaminase and lactic acid dehydrogenase [LDH]: Secondary | ICD-10-CM | POA: Diagnosis not present

## 2017-06-11 DIAGNOSIS — R7401 Elevation of levels of liver transaminase levels: Secondary | ICD-10-CM

## 2017-06-12 LAB — HEPATITIS C ANTIBODY
HEP C AB: NONREACTIVE
SIGNAL TO CUT-OFF: 0.02 (ref <1.00)

## 2017-06-12 LAB — HEPATITIS B SURFACE ANTIGEN: Hepatitis B Surface Ag: NONREACTIVE

## 2017-06-12 LAB — IRON,TIBC AND FERRITIN PANEL
%SAT: 26 % (calc) (ref 11–50)
Ferritin: 119 ng/mL (ref 10–154)
Iron: 67 ug/dL (ref 40–190)
TIBC: 259 ug/dL (ref 250–450)

## 2017-06-16 ENCOUNTER — Other Ambulatory Visit: Payer: Self-pay | Admitting: Family Medicine

## 2017-06-16 DIAGNOSIS — R740 Nonspecific elevation of levels of transaminase and lactic acid dehydrogenase [LDH]: Principal | ICD-10-CM

## 2017-06-16 DIAGNOSIS — R7401 Elevation of levels of liver transaminase levels: Secondary | ICD-10-CM

## 2017-09-14 ENCOUNTER — Other Ambulatory Visit (INDEPENDENT_AMBULATORY_CARE_PROVIDER_SITE_OTHER): Payer: 59

## 2017-09-14 DIAGNOSIS — R740 Nonspecific elevation of levels of transaminase and lactic acid dehydrogenase [LDH]: Secondary | ICD-10-CM

## 2017-09-14 DIAGNOSIS — R7401 Elevation of levels of liver transaminase levels: Secondary | ICD-10-CM

## 2017-09-15 LAB — HEPATIC FUNCTION PANEL
ALBUMIN: 4 g/dL (ref 3.5–5.2)
ALK PHOS: 81 U/L (ref 39–117)
ALT: 22 U/L (ref 0–35)
AST: 17 U/L (ref 0–37)
BILIRUBIN DIRECT: 0.1 mg/dL (ref 0.0–0.3)
BILIRUBIN TOTAL: 0.3 mg/dL (ref 0.2–1.2)
Total Protein: 7.5 g/dL (ref 6.0–8.3)

## 2018-01-22 DIAGNOSIS — J069 Acute upper respiratory infection, unspecified: Secondary | ICD-10-CM | POA: Diagnosis not present

## 2018-04-20 ENCOUNTER — Ambulatory Visit (INDEPENDENT_AMBULATORY_CARE_PROVIDER_SITE_OTHER): Payer: 59

## 2018-04-20 DIAGNOSIS — N912 Amenorrhea, unspecified: Secondary | ICD-10-CM

## 2018-04-20 DIAGNOSIS — Z3201 Encounter for pregnancy test, result positive: Secondary | ICD-10-CM

## 2018-04-20 LAB — POCT URINE PREGNANCY: PREG TEST UR: POSITIVE — AB

## 2018-04-20 NOTE — Progress Notes (Signed)
Maureen Griffin presents today for UPT. She has no unusual complaints. LMP: 02/22/18    OBJECTIVE: Appears well, in no apparent distress.  OB History    Gravida  1   Para  1   Term  1   Preterm      AB      Living  1     SAB      TAB      Ectopic      Multiple  0   Live Births  1          Home UPT Result: Positive  In-Office UPT result: Positive  I have reviewed the patient's medical, obstetrical, social, and family histories, and medications.   ASSESSMENT: Positive pregnancy test  PLAN Prenatal care to be completed at:  [facility]

## 2018-04-21 NOTE — Progress Notes (Signed)
Agree with A & P. 

## 2018-05-02 ENCOUNTER — Telehealth: Payer: Self-pay

## 2018-05-02 NOTE — Telephone Encounter (Signed)
Pt called requesting an rx for N/V. Pt states that she is not able to keep in foods or fluids down. Pt advised to go to [facility] for evaluation.

## 2018-05-12 ENCOUNTER — Ambulatory Visit (INDEPENDENT_AMBULATORY_CARE_PROVIDER_SITE_OTHER): Payer: 59 | Admitting: Obstetrics and Gynecology

## 2018-05-12 ENCOUNTER — Encounter: Payer: Self-pay | Admitting: Obstetrics and Gynecology

## 2018-05-12 DIAGNOSIS — Z3480 Encounter for supervision of other normal pregnancy, unspecified trimester: Secondary | ICD-10-CM | POA: Diagnosis not present

## 2018-05-12 DIAGNOSIS — O09899 Supervision of other high risk pregnancies, unspecified trimester: Secondary | ICD-10-CM | POA: Insufficient documentation

## 2018-05-12 DIAGNOSIS — Z113 Encounter for screening for infections with a predominantly sexual mode of transmission: Secondary | ICD-10-CM

## 2018-05-12 DIAGNOSIS — O99210 Obesity complicating pregnancy, unspecified trimester: Secondary | ICD-10-CM

## 2018-05-12 DIAGNOSIS — O99211 Obesity complicating pregnancy, first trimester: Secondary | ICD-10-CM

## 2018-05-12 DIAGNOSIS — N898 Other specified noninflammatory disorders of vagina: Secondary | ICD-10-CM | POA: Diagnosis not present

## 2018-05-12 DIAGNOSIS — Z3A11 11 weeks gestation of pregnancy: Secondary | ICD-10-CM

## 2018-05-12 MED ORDER — DOXYLAMINE-PYRIDOXINE 10-10 MG PO TBEC: 2.0000 | DELAYED_RELEASE_TABLET | Freq: Every day | ORAL | 5 refills | Status: DC

## 2018-05-12 MED ORDER — ASPIRIN EC 81 MG PO TBEC: 81.0000 mg | DELAYED_RELEASE_TABLET | Freq: Every day | ORAL | 2 refills | Status: DC

## 2018-05-12 MED ORDER — GLYCOPYRROLATE 2 MG PO TABS: 2.0000 mg | ORAL_TABLET | Freq: Three times a day (TID) | ORAL | 3 refills | Status: DC | PRN

## 2018-05-12 NOTE — Addendum Note (Signed)
Addended by: Maryruth Eve on: 05/12/2018 04:36 PM   Modules accepted: Orders

## 2018-05-12 NOTE — Patient Instructions (Signed)
First Trimester of Pregnancy The first trimester of pregnancy is from week 1 until the end of week 13 (months 1 through 3). A week after a sperm fertilizes an egg, the egg will implant on the wall of the uterus. This embryo will begin to develop into a baby. Genes from you and your partner will form the baby. The female genes will determine whether the baby will be a boy or a girl. At 6-8 weeks, the eyes and face will be formed, and the heartbeat can be seen on ultrasound. At the end of 12 weeks, all the baby's organs will be formed. Now that you are pregnant, you will want to do everything you can to have a healthy baby. Two of the most important things are to get good prenatal care and to follow your health care provider's instructions. Prenatal care is all the medical care you receive before the baby's birth. This care will help prevent, find, and treat any problems during the pregnancy and childbirth. Body changes during your first trimester Your body goes through many changes during pregnancy. The changes vary from woman to woman.  You may gain or lose a couple of pounds at first.  You may feel sick to your stomach (nauseous) and you may throw up (vomit). If the vomiting is uncontrollable, call your health care provider.  You may tire easily.  You may develop headaches that can be relieved by medicines. All medicines should be approved by your health care provider.  You may urinate more often. Painful urination may mean you have a bladder infection.  You may develop heartburn as a result of your pregnancy.  You may develop constipation because certain hormones are causing the muscles that push stool through your intestines to slow down.  You may develop hemorrhoids or swollen veins (varicose veins).  Your breasts may begin to grow larger and become tender. Your nipples may stick out more, and the tissue that surrounds them (areola) may become darker.  Your gums may bleed and may be  sensitive to brushing and flossing.  Dark spots or blotches (chloasma, mask of pregnancy) may develop on your face. This will likely fade after the baby is born.  Your menstrual periods will stop.  You may have a loss of appetite.  You may develop cravings for certain kinds of food.  You may have changes in your emotions from day to day, such as being excited to be pregnant or being concerned that something may go wrong with the pregnancy and baby.  You may have more vivid and strange dreams.  You may have changes in your hair. These can include thickening of your hair, rapid growth, and changes in texture. Some women also have hair loss during or after pregnancy, or hair that feels dry or thin. Your hair will most likely return to normal after your baby is born.  What to expect at prenatal visits During a routine prenatal visit:  You will be weighed to make sure you and the baby are growing normally.  Your blood pressure will be taken.  Your abdomen will be measured to track your baby's growth.  The fetal heartbeat will be listened to between weeks 10 and 14 of your pregnancy.  Test results from any previous visits will be discussed.  Your health care provider may ask you:  How you are feeling.  If you are feeling the baby move.  If you have had any abnormal symptoms, such as leaking fluid, bleeding, severe  headaches, or abdominal cramping.  If you are using any tobacco products, including cigarettes, chewing tobacco, and electronic cigarettes.  If you have any questions.  Other tests that may be performed during your first trimester include:  Blood tests to find your blood type and to check for the presence of any previous infections. The tests will also be used to check for low iron levels (anemia) and protein on red blood cells (Rh antibodies). Depending on your risk factors, or if you previously had diabetes during pregnancy, you may have tests to check for high blood  sugar that affects pregnant women (gestational diabetes).  Urine tests to check for infections, diabetes, or protein in the urine.  An ultrasound to confirm the proper growth and development of the baby.  Fetal screens for spinal cord problems (spina bifida) and Down syndrome.  HIV (human immunodeficiency virus) testing. Routine prenatal testing includes screening for HIV, unless you choose not to have this test.  You may need other tests to make sure you and the baby are doing well.  Follow these instructions at home: Medicines  Follow your health care provider's instructions regarding medicine use. Specific medicines may be either safe or unsafe to take during pregnancy.  Take a prenatal vitamin that contains at least 600 micrograms (mcg) of folic acid.  If you develop constipation, try taking a stool softener if your health care provider approves. Eating and drinking  Eat a balanced diet that includes fresh fruits and vegetables, whole grains, good sources of protein such as meat, eggs, or tofu, and low-fat dairy. Your health care provider will help you determine the amount of weight gain that is right for you.  Avoid raw meat and uncooked cheese. These carry germs that can cause birth defects in the baby.  Eating four or five small meals rather than three large meals a day may help relieve nausea and vomiting. If you start to feel nauseous, eating a few soda crackers can be helpful. Drinking liquids between meals, instead of during meals, also seems to help ease nausea and vomiting.  Limit foods that are high in fat and processed sugars, such as fried and sweet foods.  To prevent constipation: ? Eat foods that are high in fiber, such as fresh fruits and vegetables, whole grains, and beans. ? Drink enough fluid to keep your urine clear or pale yellow. Activity  Exercise only as directed by your health care provider. Most women can continue their usual exercise routine during  pregnancy. Try to exercise for 30 minutes at least 5 days a week. Exercising will help you: ? Control your weight. ? Stay in shape. ? Be prepared for labor and delivery.  Experiencing pain or cramping in the lower abdomen or lower back is a good sign that you should stop exercising. Check with your health care provider before continuing with normal exercises.  Try to avoid standing for long periods of time. Move your legs often if you must stand in one place for a long time.  Avoid heavy lifting.  Wear low-heeled shoes and practice good posture.  You may continue to have sex unless your health care provider tells you not to. Relieving pain and discomfort  Wear a good support bra to relieve breast tenderness.  Take warm sitz baths to soothe any pain or discomfort caused by hemorrhoids. Use hemorrhoid cream if your health care provider approves.  Rest with your legs elevated if you have leg cramps or low back pain.  If you  develop varicose veins in your legs, wear support hose. Elevate your feet for 15 minutes, 3-4 times a day. Limit salt in your diet. Prenatal care  Schedule your prenatal visits by the twelfth week of pregnancy. They are usually scheduled monthly at first, then more often in the last 2 months before delivery.  Write down your questions. Take them to your prenatal visits.  Keep all your prenatal visits as told by your health care provider. This is important. Safety  Wear your seat belt at all times when driving.  Make a list of emergency phone numbers, including numbers for family, friends, the hospital, and police and fire departments. General instructions  Ask your health care provider for a referral to a local prenatal education class. Begin classes no later than the beginning of month 6 of your pregnancy.  Ask for help if you have counseling or nutritional needs during pregnancy. Your health care provider can offer advice or refer you to specialists for help  with various needs.  Do not use hot tubs, steam rooms, or saunas.  Do not douche or use tampons or scented sanitary pads.  Do not cross your legs for long periods of time.  Avoid cat litter boxes and soil used by cats. These carry germs that can cause birth defects in the baby and possibly loss of the fetus by miscarriage or stillbirth.  Avoid all smoking, herbs, alcohol, and medicines not prescribed by your health care provider. Chemicals in these products affect the formation and growth of the baby.  Do not use any products that contain nicotine or tobacco, such as cigarettes and e-cigarettes. If you need help quitting, ask your health care provider. You may receive counseling support and other resources to help you quit.  Schedule a dentist appointment. At home, brush your teeth with a soft toothbrush and be gentle when you floss. Contact a health care provider if:  You have dizziness.  You have mild pelvic cramps, pelvic pressure, or nagging pain in the abdominal area.  You have persistent nausea, vomiting, or diarrhea.  You have a bad smelling vaginal discharge.  You have pain when you urinate.  You notice increased swelling in your face, hands, legs, or ankles.  You are exposed to fifth disease or chickenpox.  You are exposed to [place] measles (rubella) and have never had it. Get help right away if:  You have a fever.  You are leaking fluid from your vagina.  You have spotting or bleeding from your vagina.  You have severe abdominal cramping or pain.  You have rapid weight gain or loss.  You vomit blood or material that looks like coffee grounds.  You develop a severe headache.  You have shortness of breath.  You have any kind of trauma, such as from a fall or a car accident. Summary  The first trimester of pregnancy is from week 1 until the end of week 13 (months 1 through 3).  Your body goes through many changes during pregnancy. The changes vary from  woman to woman.  You will have routine prenatal visits. During those visits, your health care provider will examine you, discuss any test results you may have, and talk with you about how you are feeling. This information is not intended to replace advice given to you by your health care provider. Make sure you discuss any questions you have with your health care provider. Document Released: 06/16/2001 Document Revised: 06/03/2016 Document Reviewed: 06/03/2016 Elsevier Interactive Patient Education  2018  [place] of Pregnancy The second trimester is from week 14 through week 27 (months 4 through 6). The second trimester is often a time when you feel your best. Your body has adjusted to being pregnant, and you begin to feel better physically. Usually, morning sickness has lessened or quit completely, you may have more energy, and you may have an increase in appetite. The second trimester is also a time when the fetus is growing rapidly. At the end of the sixth month, the fetus is about 9 inches long and weighs about 1 pounds. You will likely begin to feel the baby move (quickening) between 16 and 20 weeks of pregnancy. Body changes during your second trimester Your body continues to go through many changes during your second trimester. The changes vary from woman to woman.  Your weight will continue to increase. You will notice your lower abdomen bulging out.  You may begin to get stretch marks on your hips, abdomen, and breasts.  You may develop headaches that can be relieved by medicines. The medicines should be approved by your health care provider.  You may urinate more often because the fetus is pressing on your bladder.  You may develop or continue to have heartburn as a result of your pregnancy.  You may develop constipation because certain hormones are causing the muscles that push waste through your intestines to slow down.  You may develop hemorrhoids or  swollen, bulging veins (varicose veins).  You may have back pain. This is caused by: ? Weight gain. ? Pregnancy hormones that are relaxing the joints in your pelvis. ? A shift in weight and the muscles that support your balance.  Your breasts will continue to grow and they will continue to become tender.  Your gums may bleed and may be sensitive to brushing and flossing.  Dark spots or blotches (chloasma, mask of pregnancy) may develop on your face. This will likely fade after the baby is born.  A dark line from your belly button to the pubic area (linea nigra) may appear. This will likely fade after the baby is born.  You may have changes in your hair. These can include thickening of your hair, rapid growth, and changes in texture. Some women also have hair loss during or after pregnancy, or hair that feels dry or thin. Your hair will most likely return to normal after your baby is born.  What to expect at prenatal visits During a routine prenatal visit:  You will be weighed to make sure you and the fetus are growing normally.  Your blood pressure will be taken.  Your abdomen will be measured to track your baby's growth.  The fetal heartbeat will be listened to.  Any test results from the previous visit will be discussed.  Your health care provider may ask you:  How you are feeling.  If you are feeling the baby move.  If you have had any abnormal symptoms, such as leaking fluid, bleeding, severe headaches, or abdominal cramping.  If you are using any tobacco products, including cigarettes, chewing tobacco, and electronic cigarettes.  If you have any questions.  Other tests that may be performed during your second trimester include:  Blood tests that check for: ? Low iron levels (anemia). ? High blood sugar that affects pregnant women (gestational diabetes) between 40 and 28 weeks. ? Rh antibodies. This is to check for a protein on red blood cells (Rh factor).  Urine  tests to  check for infections, diabetes, or protein in the urine.  An ultrasound to confirm the proper growth and development of the baby.  An amniocentesis to check for possible genetic problems.  Fetal screens for spina bifida and Down syndrome.  HIV (human immunodeficiency virus) testing. Routine prenatal testing includes screening for HIV, unless you choose not to have this test.  Follow these instructions at home: Medicines  Follow your health care provider's instructions regarding medicine use. Specific medicines may be either safe or unsafe to take during pregnancy.  Take a prenatal vitamin that contains at least 600 micrograms (mcg) of folic acid.  If you develop constipation, try taking a stool softener if your health care provider approves. Eating and drinking  Eat a balanced diet that includes fresh fruits and vegetables, whole grains, good sources of protein such as meat, eggs, or tofu, and low-fat dairy. Your health care provider will help you determine the amount of weight gain that is right for you.  Avoid raw meat and uncooked cheese. These carry germs that can cause birth defects in the baby.  If you have low calcium intake from food, talk to your health care provider about whether you should take a daily calcium supplement.  Limit foods that are high in fat and processed sugars, such as fried and sweet foods.  To prevent constipation: ? Drink enough fluid to keep your urine clear or pale yellow. ? Eat foods that are high in fiber, such as fresh fruits and vegetables, whole grains, and beans. Activity  Exercise only as directed by your health care provider. Most women can continue their usual exercise routine during pregnancy. Try to exercise for 30 minutes at least 5 days a week. Stop exercising if you experience uterine contractions.  Avoid heavy lifting, wear low heel shoes, and practice good posture.  A sexual relationship may be continued unless your health  care provider directs you otherwise. Relieving pain and discomfort  Wear a good support bra to prevent discomfort from breast tenderness.  Take warm sitz baths to soothe any pain or discomfort caused by hemorrhoids. Use hemorrhoid cream if your health care provider approves.  Rest with your legs elevated if you have leg cramps or low back pain.  If you develop varicose veins, wear support hose. Elevate your feet for 15 minutes, 3-4 times a day. Limit salt in your diet. Prenatal Care  Write down your questions. Take them to your prenatal visits.  Keep all your prenatal visits as told by your health care provider. This is important. Safety  Wear your seat belt at all times when driving.  Make a list of emergency phone numbers, including numbers for family, friends, the hospital, and police and fire departments. General instructions  Ask your health care provider for a referral to a local prenatal education class. Begin classes no later than the beginning of month 6 of your pregnancy.  Ask for help if you have counseling or nutritional needs during pregnancy. Your health care provider can offer advice or refer you to specialists for help with various needs.  Do not use hot tubs, steam rooms, or saunas.  Do not douche or use tampons or scented sanitary pads.  Do not cross your legs for long periods of time.  Avoid cat litter boxes and soil used by cats. These carry germs that can cause birth defects in the baby and possibly loss of the fetus by miscarriage or stillbirth.  Avoid all smoking, herbs, alcohol, and unprescribed drugs. Chemicals  in these products can affect the formation and growth of the baby.  Do not use any products that contain nicotine or tobacco, such as cigarettes and e-cigarettes. If you need help quitting, ask your health care provider.  Visit your dentist if you have not gone yet during your pregnancy. Use a soft toothbrush to brush your teeth and be gentle when  you floss. Contact a health care provider if:  You have dizziness.  You have mild pelvic cramps, pelvic pressure, or nagging pain in the abdominal area.  You have persistent nausea, vomiting, or diarrhea.  You have a bad smelling vaginal discharge.  You have pain when you urinate. Get help right away if:  You have a fever.  You are leaking fluid from your vagina.  You have spotting or bleeding from your vagina.  You have severe abdominal cramping or pain.  You have rapid weight gain or weight loss.  You have shortness of breath with chest pain.  You notice sudden or extreme swelling of your face, hands, ankles, feet, or legs.  You have not felt your baby move in over an hour.  You have severe headaches that do not go away when you take medicine.  You have vision changes. Summary  The second trimester is from week 14 through week 27 (months 4 through 6). It is also a time when the fetus is growing rapidly.  Your body goes through many changes during pregnancy. The changes vary from woman to woman.  Avoid all smoking, herbs, alcohol, and unprescribed drugs. These chemicals affect the formation and growth your baby.  Do not use any tobacco products, such as cigarettes, chewing tobacco, and e-cigarettes. If you need help quitting, ask your health care provider.  Contact your health care provider if you have any questions. Keep all prenatal visits as told by your health care provider. This is important. This information is not intended to replace advice given to you by your health care provider. Make sure you discuss any questions you have with your health care provider. Document Released: 06/16/2001 Document Revised: 07/28/2016 Document Reviewed: 07/28/2016 Elsevier Interactive Patient Education  2018 [employer].   Contraception Choices Contraception, also called birth control, refers to methods or devices that prevent pregnancy. Hormonal methods Contraceptive  implant A contraceptive implant is a thin, plastic tube that contains a hormone. It is inserted into the upper part of the arm. It can remain in place for up to 3 years. Progestin-only injections Progestin-only injections are injections of progestin, a synthetic form of the hormone progesterone. They are given every 3 months by a health care provider. Birth control pills Birth control pills are pills that contain hormones that prevent pregnancy. They must be taken once a day, preferably at the same time each day. Birth control patch The birth control patch contains hormones that prevent pregnancy. It is placed on the skin and must be changed once a week for three weeks and removed on the fourth week. A prescription is needed to use this method of contraception. Vaginal ring A vaginal ring contains hormones that prevent pregnancy. It is placed in the vagina for three weeks and removed on the fourth week. After that, the process is repeated with a new ring. A prescription is needed to use this method of contraception. Emergency contraceptive Emergency contraceptives prevent pregnancy after unprotected sex. They come in pill form and can be taken up to 5 days after sex. They work best the sooner they are taken after having  sex. Most emergency contraceptives are available without a prescription. This method should not be used as your only form of birth control. Barrier methods Female condom A female condom is a thin sheath that is worn over the penis during sex. Condoms keep sperm from going inside a woman's body. They can be used with a spermicide to increase their effectiveness. They should be disposed after a single use. Female condom A female condom is a soft, loose-fitting sheath that is put into the vagina before sex. The condom keeps sperm from going inside a woman's body. They should be disposed after a single use. Diaphragm A diaphragm is a soft, dome-shaped barrier. It is inserted into the vagina  before sex, along with a spermicide. The diaphragm blocks sperm from entering the uterus, and the spermicide kills sperm. A diaphragm should be left in the vagina for 6-8 hours after sex and removed within 24 hours. A diaphragm is prescribed and fitted by a health care provider. A diaphragm should be replaced every 1-2 years, after giving birth, after gaining more than 15 lb (6.8 kg), and after pelvic surgery. Cervical cap A cervical cap is a round, soft latex or plastic cup that fits over the cervix. It is inserted into the vagina before sex, along with spermicide. It blocks sperm from entering the uterus. The cap should be left in place for 6-8 hours after sex and removed within 48 hours. A cervical cap must be prescribed and fitted by a health care provider. It should be replaced every 2 years. Sponge A sponge is a soft, circular piece of polyurethane foam with spermicide on it. The sponge helps block sperm from entering the uterus, and the spermicide kills sperm. To use it, you make it wet and then insert it into the vagina. It should be inserted before sex, left in for at least 6 hours after sex, and removed and thrown away within 30 hours. Spermicides Spermicides are chemicals that kill or block sperm from entering the cervix and uterus. They can come as a cream, jelly, suppository, foam, or tablet. A spermicide should be inserted into the vagina with an applicator at least 33-82 minutes before sex to allow time for it to work. The process must be repeated every time you have sex. Spermicides do not require a prescription. Intrauterine contraception Intrauterine device (IUD) An IUD is a T-shaped device that is put in a woman's uterus. There are two types:  Hormone IUD.This type contains progestin, a synthetic form of the hormone progesterone. This type can stay in place for 3-5 years.  Copper IUD.This type is wrapped in copper wire. It can stay in place for 10 years.  Permanent methods of  contraception Female tubal ligation In this method, a woman's fallopian tubes are sealed, tied, or blocked during surgery to prevent eggs from traveling to the uterus. Hysteroscopic sterilization In this method, a small, flexible insert is placed into each fallopian tube. The inserts cause scar tissue to form in the fallopian tubes and block them, so sperm cannot reach an egg. The procedure takes about 3 months to be effective. Another form of birth control must be used during those 3 months. Female sterilization This is a procedure to tie off the tubes that carry sperm (vasectomy). After the procedure, the man can still ejaculate fluid (semen). Natural planning methods Natural family planning In this method, a couple does not have sex on days when the woman could become pregnant. Calendar method This means keeping track of the  length of each menstrual cycle, identifying the days when pregnancy can happen, and not having sex on those days. Ovulation method In this method, a couple avoids sex during ovulation. Symptothermal method This method involves not having sex during ovulation. The woman typically checks for ovulation by watching changes in her temperature and in the consistency of cervical mucus. Post-ovulation method In this method, a couple waits to have sex until after ovulation. Summary  Contraception, also called birth control, means methods or devices that prevent pregnancy.  Hormonal methods of contraception include implants, injections, pills, patches, vaginal rings, and emergency contraceptives.  Barrier methods of contraception can include female condoms, female condoms, diaphragms, cervical caps, sponges, and spermicides.  There are two types of IUDs (intrauterine devices). An IUD can be put in a woman's uterus to prevent pregnancy for 3-5 years.  Permanent sterilization can be done through a procedure for males, females, or both.  Natural family planning methods involve  not having sex on days when the woman could become pregnant. This information is not intended to replace advice given to you by your health care provider. Make sure you discuss any questions you have with your health care provider. Document Released: 06/22/2005 Document Revised: 07/25/2016 Document Reviewed: 07/25/2016 Elsevier Interactive Patient Education  2018 [employer].   Breastfeeding Choosing to breastfeed is one of the best decisions you can make for yourself and your baby. A change in hormones during pregnancy causes your breasts to make breast milk in your milk-producing glands. Hormones prevent breast milk from being released before your baby is born. They also prompt milk flow after birth. Once breastfeeding has begun, thoughts of your baby, as well as his or her sucking or crying, can stimulate the release of milk from your milk-producing glands. Benefits of breastfeeding Research shows that breastfeeding offers many health benefits for infants and mothers. It also offers a cost-free and convenient way to feed your baby. For your baby  Your first milk (colostrum) helps your baby's digestive system to function better.  Special cells in your milk (antibodies) help your baby to fight off infections.  Breastfed babies are less likely to develop asthma, allergies, obesity, or type 2 diabetes. They are also at lower risk for sudden infant death syndrome (SIDS).  Nutrients in breast milk are better able to meet your baby's needs compared to infant formula.  Breast milk improves your baby's brain development. For you  Breastfeeding helps to create a very special bond between you and your baby.  Breastfeeding is convenient. Breast milk costs nothing and is always available at the correct temperature.  Breastfeeding helps to burn calories. It helps you to lose the weight that you gained during pregnancy.  Breastfeeding makes your uterus return faster to its size before pregnancy.  It also slows bleeding (lochia) after you give birth.  Breastfeeding helps to lower your risk of developing type 2 diabetes, osteoporosis, rheumatoid arthritis, cardiovascular disease, and breast, ovarian, uterine, and endometrial cancer later in life. Breastfeeding basics Starting breastfeeding  Find a comfortable place to sit or lie down, with your neck and back well-supported.  Place a pillow or a rolled-up blanket under your baby to bring him or her to the level of your breast (if you are seated). Nursing pillows are specially designed to help support your arms and your baby while you breastfeed.  Make sure that your baby's tummy (abdomen) is facing your abdomen.  Gently massage your breast. With your fingertips, massage from the outer edges of  your breast inward toward the nipple. This encourages milk flow. If your milk flows slowly, you may need to continue this action during the feeding.  Support your breast with 4 fingers underneath and your thumb above your nipple (make the letter "C" with your hand). Make sure your fingers are well away from your nipple and your baby's mouth.  Stroke your baby's lips gently with your finger or nipple.  When your baby's mouth is open wide enough, quickly bring your baby to your breast, placing your entire nipple and as much of the areola as possible into your baby's mouth. The areola is the colored area around your nipple. ? More areola should be visible above your baby's upper lip than below the lower lip. ? Your baby's lips should be opened and extended outward (flanged) to ensure an adequate, comfortable latch. ? Your baby's tongue should be between his or her lower gum and your breast.  Make sure that your baby's mouth is correctly positioned around your nipple (latched). Your baby's lips should create a seal on your breast and be turned out (everted).  It is common for your baby to suck about 2-3 minutes in order to start the flow of breast  milk. Latching Teaching your baby how to latch onto your breast properly is very important. An improper latch can cause nipple pain, decreased milk supply, and poor weight gain in your baby. Also, if your baby is not latched onto your nipple properly, he or she may swallow some air during feeding. This can make your baby fussy. Burping your baby when you switch breasts during the feeding can help to get rid of the air. However, teaching your baby to latch on properly is still the best way to prevent fussiness from swallowing air while breastfeeding. Signs that your baby has successfully latched onto your nipple  Silent tugging or silent sucking, without causing you pain. Infant's lips should be extended outward (flanged).  Swallowing heard between every 3-4 sucks once your milk has started to flow (after your let-down milk reflex occurs).  Muscle movement above and in front of his or her ears while sucking.  Signs that your baby has not successfully latched onto your nipple  Sucking sounds or smacking sounds from your baby while breastfeeding.  Nipple pain.  If you think your baby has not latched on correctly, slip your finger into the corner of your baby's mouth to break the suction and place it between your baby's gums. Attempt to start breastfeeding again. Signs of successful breastfeeding Signs from your baby  Your baby will gradually decrease the number of sucks or will completely stop sucking.  Your baby will fall asleep.  Your baby's body will relax.  Your baby will retain a small amount of milk in his or her mouth.  Your baby will let go of your breast by himself or herself.  Signs from you  Breasts that have increased in firmness, weight, and size 1-3 hours after feeding.  Breasts that are softer immediately after breastfeeding.  Increased milk volume, as well as a change in milk consistency and color by the fifth day of breastfeeding.  Nipples that are not sore,  cracked, or bleeding.  Signs that your baby is getting enough milk  Wetting at least 1-2 diapers during the first 24 hours after birth.  Wetting at least 5-6 diapers every 24 hours for the first week after birth. The urine should be clear or pale yellow by the age of 43  days.  Wetting 6-8 diapers every 24 hours as your baby continues to grow and develop.  At least 3 stools in a 24-hour period by the age of 5 days. The stool should be soft and yellow.  At least 3 stools in a 24-hour period by the age of 7 days. The stool should be seedy and yellow.  No loss of weight greater than 10% of birth weight during the first 3 days of life.  Average weight gain of 4-7 oz (113-198 g) per week after the age of 4 days.  Consistent daily weight gain by the age of 5 days, without weight loss after the age of 2 weeks. After a feeding, your baby may spit up a small amount of milk. This is normal. Breastfeeding frequency and duration Frequent feeding will help you make more milk and can prevent sore nipples and extremely full breasts (breast engorgement). Breastfeed when you feel the need to reduce the fullness of your breasts or when your baby shows signs of hunger. This is called "breastfeeding on demand." Signs that your baby is hungry include:  Increased alertness, activity, or restlessness.  Movement of the head from side to side.  Opening of the mouth when the corner of the mouth or cheek is stroked (rooting).  Increased sucking sounds, smacking lips, cooing, sighing, or squeaking.  Hand-to-mouth movements and sucking on fingers or hands.  Fussing or crying.  Avoid introducing a pacifier to your baby in the first 4-6 weeks after your baby is born. After this time, you may choose to use a pacifier. Research has shown that pacifier use during the first year of a baby's life decreases the risk of sudden infant death syndrome (SIDS). Allow your baby to feed on each breast as long as he or she  wants. When your baby unlatches or falls asleep while feeding from the first breast, offer the second breast. Because newborns are often sleepy in the first few weeks of life, you may need to awaken your baby to get him or her to feed. Breastfeeding times will vary from baby to baby. However, the following rules can serve as a guide to help you make sure that your baby is properly fed:  Newborns (babies 40 weeks of age or younger) may breastfeed every 1-3 hours.  Newborns should not go without breastfeeding for longer than 3 hours during the day or 5 hours during the night.  You should breastfeed your baby a minimum of 8 times in a 24-hour period.  Breast milk pumping Pumping and storing breast milk allows you to make sure that your baby is exclusively fed your breast milk, even at times when you are unable to breastfeed. This is especially important if you go back to work while you are still breastfeeding, or if you are not able to be present during feedings. Your lactation consultant can help you find a method of pumping that works best for you and give you guidelines about how long it is safe to store breast milk. Caring for your breasts while you breastfeed Nipples can become dry, cracked, and sore while breastfeeding. The following recommendations can help keep your breasts moisturized and healthy:  Avoid using soap on your nipples.  Wear a supportive bra designed especially for nursing. Avoid wearing underwire-style bras or extremely tight bras (sports bras).  Air-dry your nipples for 3-4 minutes after each feeding.  Use only cotton bra pads to absorb leaked breast milk. Leaking of breast milk between feedings is normal.  Use lanolin on your nipples after breastfeeding. Lanolin helps to maintain your skin's normal moisture barrier. Pure lanolin is not harmful (not toxic) to your baby. You may also hand express a few drops of breast milk and gently massage that milk into your nipples and  allow the milk to air-dry.  In the first few weeks after giving birth, some women experience breast engorgement. Engorgement can make your breasts feel heavy, warm, and tender to the touch. Engorgement peaks within 3-5 days after you give birth. The following recommendations can help to ease engorgement:  Completely empty your breasts while breastfeeding or pumping. You may want to start by applying warm, moist heat (in the shower or with warm, water-soaked hand towels) just before feeding or pumping. This increases circulation and helps the milk flow. If your baby does not completely empty your breasts while breastfeeding, pump any extra milk after he or she is finished.  Apply ice packs to your breasts immediately after breastfeeding or pumping, unless this is too uncomfortable for you. To do this: ? Put ice in a plastic bag. ? Place a towel between your skin and the bag. ? Leave the ice on for 20 minutes, 2-3 times a day.  Make sure that your baby is latched on and positioned properly while breastfeeding.  If engorgement persists after 48 hours of following these recommendations, contact your health care provider or a Science writer. Overall health care recommendations while breastfeeding  Eat 3 healthy meals and 3 snacks every day. Well-nourished mothers who are breastfeeding need an additional 450-500 calories a day. You can meet this requirement by increasing the amount of a balanced diet that you eat.  Drink enough water to keep your urine pale yellow or clear.  Rest often, relax, and continue to take your prenatal vitamins to prevent fatigue, stress, and low vitamin and mineral levels in your body (nutrient deficiencies).  Do not use any products that contain nicotine or tobacco, such as cigarettes and e-cigarettes. Your baby may be harmed by chemicals from cigarettes that pass into breast milk and exposure to secondhand smoke. If you need help quitting, ask your health care  provider.  Avoid alcohol.  Do not use illegal drugs or marijuana.  Talk with your health care provider before taking any medicines. These include over-the-counter and prescription medicines as well as vitamins and herbal supplements. Some medicines that may be harmful to your baby can pass through breast milk.  It is possible to become pregnant while breastfeeding. If birth control is desired, ask your health care provider about options that will be safe while breastfeeding your baby. Where to find more information: [employer]: www.llli.org Contact a health care provider if:  You feel like you want to stop breastfeeding or have become frustrated with breastfeeding.  Your nipples are cracked or bleeding.  Your breasts are red, tender, or warm.  You have: ? Painful breasts or nipples. ? A swollen area on either breast. ? A fever or chills. ? Nausea or vomiting. ? Drainage other than breast milk from your nipples.  Your breasts do not become full before feedings by the fifth day after you give birth.  You feel sad and depressed.  Your baby is: ? Too sleepy to eat well. ? Having trouble sleeping. ? More than 46 week old and wetting fewer than 6 diapers in a 24-hour period. ? Not gaining weight by 60 days of age.  Your baby has fewer than 3 stools in a  24-hour period.  Your baby's skin or the white parts of his or her eyes become yellow. Get help right away if:  Your baby is overly tired (lethargic) and does not want to wake up and feed.  Your baby develops an unexplained fever. Summary  Breastfeeding offers many health benefits for infant and mothers.  Try to breastfeed your infant when he or she shows early signs of hunger.  Gently tickle or stroke your baby's lips with your finger or nipple to allow the baby to open his or her mouth. Bring the baby to your breast. Make sure that much of the areola is in your baby's mouth. Offer one side and burp the  baby before you offer the other side.  Talk with your health care provider or lactation consultant if you have questions or you face problems as you breastfeed. This information is not intended to replace advice given to you by your health care provider. Make sure you discuss any questions you have with your health care provider. Document Released: 06/22/2005 Document Revised: 07/24/2016 Document Reviewed: 07/24/2016 Elsevier Interactive Patient Education  [employer].

## 2018-05-12 NOTE — Progress Notes (Signed)
Pt presents for NOB visit c/o ptyalism.  No other concerns per pt. Husband is supportive.

## 2018-05-12 NOTE — Progress Notes (Signed)
  Subjective:    Maureen Griffin is a G2P1001 [redacted]w[redacted]d being seen today for her first obstetrical visit.  Her obstetrical history is significant for short interval between pregnancy (SVD 05/02/2018) and maternal obesity. Patient does intend to breast feed. Pregnancy history fully reviewed.  Patient reports nausea.  Vitals:   05/12/18 1510  BP: 130/78  Pulse: 68  Weight: (!) 304 lb 8 oz (138.1 kg)    HISTORY: OB History  Gravida Para Term Preterm AB Living  2 1 1     1   SAB TAB Ectopic Multiple Live Births        0 1    # Outcome Date GA Lbr Len/2nd Weight Sex Delivery Anes PTL Lv  2 Current           1 Term 05/02/17 [redacted]w[redacted]d 09:07 / 00:33 6 lb 1.7 oz (2.77 kg) F Vag-Spont EPI  LIV   Past Medical History:  Diagnosis Date  . Fibroid   . Morbid obesity ([place]) 04/24/2016   Past Surgical History:  Procedure Laterality Date  . NO PAST SURGERIES     Family History  Problem Relation Age of Onset  . Healthy Mother   . Cancer Neg Hx   . Diabetes Neg Hx   . Hypertension Neg Hx   . Stroke Neg Hx      Exam    Uterus:     Pelvic Exam:    Perineum: No Hemorrhoids, Normal Perineum   Vulva: normal   Vagina:  normal mucosa, normal discharge   pH:    Cervix: multiparous appearance   Adnexa: normal adnexa and no mass, fullness, tenderness   Bony Pelvis: gynecoid  System: Breast:  normal appearance, no masses or tenderness   Skin: normal coloration and turgor, no rashes    Neurologic: oriented, no focal deficits   Extremities: normal strength, tone, and muscle mass   HEENT extra ocular movement intact   Mouth/Teeth mucous membranes moist, pharynx normal without lesions and dental hygiene good   Neck supple and no masses   Cardiovascular: regular rate and rhythm   Respiratory:  appears well, vitals normal, no respiratory distress, acyanotic, normal RR, neck free of mass or lymphadenopathy, chest clear, no wheezing, crepitations, rhonchi, normal symmetric air entry   Abdomen: soft,  non-tender; bowel sounds normal; no masses,  no organomegaly   Urinary:       Assessment:    Pregnancy: G2P1001 Patient Active Problem List   Diagnosis Date Noted  . Short interval between pregnancies affecting pregnancy, antepartum 05/12/2018  . Maternal morbid obesity, antepartum ([place]) 05/12/2018  . Normal labor 05/02/2017  . Indication for care in labor or delivery 05/02/2017  . Uterine fibroid during pregnancy, antepartum 11/11/2016  . Supervision of other normal pregnancy, antepartum 11/04/2016  . Chlamydia infection affecting pregnancy 11/04/2016  . Morbid obesity ([place]) 04/24/2016        Plan:     Initial labs drawn. Prenatal vitamins. Problem list reviewed and updated. Genetic Screening discussed : panorama ordered.  Ultrasound discussed; fetal survey: ordered. Patient interested in optimized schedule Rx ASA provided to start at 12 weeks Referral to nutritionist provided  Follow up in 4 weeks. 50% of 30 min visit spent on counseling and coordination of care.     Tereasa Yilmaz 05/12/2018

## 2018-05-13 LAB — CERVICOVAGINAL ANCILLARY ONLY
CHLAMYDIA, DNA PROBE: NEGATIVE
NEISSERIA GONORRHEA: NEGATIVE
TRICH (WINDOWPATH): NEGATIVE

## 2018-05-13 LAB — HEMOGLOBIN A1C
ESTIMATED AVERAGE GLUCOSE: 100 mg/dL
HEMOGLOBIN A1C: 5.1 % (ref 4.8–5.6)

## 2018-05-15 LAB — CULTURE, OB URINE

## 2018-05-15 LAB — URINE CULTURE, OB REFLEX

## 2018-05-17 LAB — URINE CYTOLOGY ANCILLARY ONLY
Bacterial vaginitis: NEGATIVE
Candida vaginitis: NEGATIVE

## 2018-05-19 LAB — OBSTETRIC PANEL, INCLUDING HIV
ANTIBODY SCREEN: NEGATIVE
BASOS: 0 %
Basophils Absolute: 0 10*3/uL (ref 0.0–0.2)
EOS (ABSOLUTE): 0.1 10*3/uL (ref 0.0–0.4)
EOS: 1 %
HEMATOCRIT: 39 % (ref 34.0–46.6)
HEMOGLOBIN: 12.9 g/dL (ref 11.1–15.9)
HEP B S AG: NEGATIVE
HIV Screen 4th Generation wRfx: NONREACTIVE
IMMATURE GRANS (ABS): 0 10*3/uL (ref 0.0–0.1)
Immature Granulocytes: 0 %
Lymphocytes Absolute: 2.7 10*3/uL (ref 0.7–3.1)
Lymphs: 29 %
MCH: 27.9 pg (ref 26.6–33.0)
MCHC: 33.1 g/dL (ref 31.5–35.7)
MCV: 84 fL (ref 79–97)
MONOS ABS: 0.9 10*3/uL (ref 0.1–0.9)
Monocytes: 10 %
Neutrophils Absolute: 5.6 10*3/uL (ref 1.4–7.0)
Neutrophils: 60 %
Platelets: 266 10*3/uL (ref 150–450)
RBC: 4.63 x10E6/uL (ref 3.77–5.28)
RDW: 14.3 % (ref 12.3–15.4)
RH TYPE: POSITIVE
RPR: NONREACTIVE
RUBELLA: 12 {index} (ref >0.99)
WBC: 9.4 10*3/uL (ref 3.4–10.8)

## 2018-05-19 LAB — HEMOGLOBINOPATHY EVALUATION
HEMOGLOBIN A2 QUANTITATION: 3.4 % — AB (ref 1.8–3.2)
HGB C: 39 % — ABNORMAL HIGH
HGB S: 0 %
HGB VARIANT: 0 %
Hemoglobin F Quantitation: 0.8 % (ref 0.0–2.0)
Hgb A: 56.8 % — ABNORMAL LOW (ref 96.4–98.8)

## 2018-05-20 LAB — SMN1 COPY NUMBER ANALYSIS (SMA CARRIER SCREENING)

## 2018-05-20 LAB — CYSTIC FIBROSIS MUTATION 97: Interpretation: NOT DETECTED

## 2018-06-01 ENCOUNTER — Ambulatory Visit: Payer: 59 | Admitting: Skilled Nursing Facility1

## 2018-06-06 ENCOUNTER — Ambulatory Visit: Payer: 59 | Admitting: Skilled Nursing Facility1

## 2018-06-07 ENCOUNTER — Telehealth: Payer: Self-pay

## 2018-06-07 NOTE — Telephone Encounter (Signed)
Patient called wanting another rx for nausea because insurance does not cover diclegis. Routed to provider for review.

## 2018-06-08 ENCOUNTER — Ambulatory Visit (INDEPENDENT_AMBULATORY_CARE_PROVIDER_SITE_OTHER): Payer: 59 | Admitting: Family Medicine

## 2018-06-08 ENCOUNTER — Encounter: Payer: 59 | Attending: Obstetrics and Gynecology | Admitting: Skilled Nursing Facility1

## 2018-06-08 ENCOUNTER — Telehealth: Payer: Self-pay

## 2018-06-08 ENCOUNTER — Encounter: Payer: Self-pay | Admitting: Family Medicine

## 2018-06-08 ENCOUNTER — Encounter: Payer: Self-pay | Admitting: Skilled Nursing Facility1

## 2018-06-08 VITALS — BP 122/80 | HR 76 | Temp 98.1°F | Ht 68.0 in | Wt 300.5 lb

## 2018-06-08 DIAGNOSIS — Z3A00 Weeks of gestation of pregnancy not specified: Secondary | ICD-10-CM | POA: Insufficient documentation

## 2018-06-08 DIAGNOSIS — Z0000 Encounter for general adult medical examination without abnormal findings: Secondary | ICD-10-CM | POA: Diagnosis not present

## 2018-06-08 DIAGNOSIS — O99210 Obesity complicating pregnancy, unspecified trimester: Secondary | ICD-10-CM | POA: Insufficient documentation

## 2018-06-08 DIAGNOSIS — Z713 Dietary counseling and surveillance: Secondary | ICD-10-CM | POA: Diagnosis not present

## 2018-06-08 DIAGNOSIS — O99212 Obesity complicating pregnancy, second trimester: Secondary | ICD-10-CM

## 2018-06-08 MED ORDER — PROMETHAZINE HCL 25 MG PO TABS: 25.0000 mg | ORAL_TABLET | Freq: Four times a day (QID) | ORAL | 1 refills | Status: DC | PRN

## 2018-06-08 NOTE — Addendum Note (Signed)
Addended by: Mora Bellman on: 06/08/2018 01:08 PM   Modules accepted: Orders

## 2018-06-08 NOTE — Progress Notes (Signed)
Pre visit review using our clinic review tool, if applicable. No additional management support is needed unless otherwise documented below in the visit note. 

## 2018-06-08 NOTE — Progress Notes (Signed)
  Assessment:  Primary concerns today: obesity in pregnancy.   Pt states she has nausea daily. Pt states she will vomit if she does not take her nausea medication. Pt states he is about 4 months along in her pregnancy. Pt states anything sugary makes her sick so she does not eat it. Pt states this pregnancy has been rough due to the emesis. Pt states since she has been pregnant she has had no appetite which makes it hard for her to eat. Pt states he has been very stressed because of feeling so sick. Pt states she has an over production pf saliva daily so she stays snacking throughout he day for the saliva only when she is going out but at home she just spits into a cup. More trouble with meats. Pt states she does not take her prenatal vitamin because it hurts her stomach. Pt states animal meats such as chicken causeher more nausea.  Pt states she would like to come back for a follow up after she gives birth.   MEDICATIONS: See List   DIETARY INTAKE:  Usual eating pattern includes 2 meals and 0 snacks per day.  Everyday foods include none identified.  Avoided foods include none identified.    24-hr recall:  B ( AM): 1 sausage and toast and lipton tea  Snk ( AM):  L ( PM): rice and tomato sauce  Snk ( PM): D ( PM): soup with rice  Snk ( PM):  Beverages: lipton tea, water, regular gatorade,   Usual physical activity: 2 days a week walking 15-30 minutes   Estimated energy needs: 2000 calories 225 g carbohydrates 150 g protein 56 g fat  Progress Towards Goal(s):  In progress.    Intervention:  Nutrition cousneling. Dietitian educated the pt on balanced meals to feed herself and her in utero child. Dietitian she focus on eating balanced meals and being physically active. Dietitian educated the pt on foods and dietary habits to limit nausea and vomiting during preganancy. Pt was educated on the limiting effect of nutrients when following the keto diet and was advised not to follow it while  pregnant or breast feeding.  Goals: -vitafusion prenatal multivitmain double the dose -supplement drink if you cannot eat a meal -chew until appselsauce consistency  -Eat tums daily  -Avoid tomato sauce  -Eat every 3-5 hours 7 days a week; using the meal ideas sheet to put together balanced meals  -Do not eat animal meats: eat vegetarian proteins and seafood -Do not do the keto diet while you are breast feeding   Teaching Method Utilized:  Visual Auditory Hands on  Handouts given during visit include:  Meal ideas  Barriers to learning/adherence to lifestyle change: nausea/lack of appetite   Demonstrated degree of understanding via:  Teach Back   Monitoring/Evaluation:  Dietary intake, exercise, and body weight prn.

## 2018-06-08 NOTE — Telephone Encounter (Signed)
Attempted to contact about rx sent to pharmacy, no answer, unable to leave vm.

## 2018-06-08 NOTE — Patient Instructions (Signed)
Give us 2-3 business days to get the results of your labs back.   Keep the diet clean and stay active.  Let us know if you need anything. 

## 2018-06-08 NOTE — Progress Notes (Signed)
Chief Complaint  Patient presents with  . Annual Exam     Well Woman Maureen Griffin is here for a complete physical.   Her last physical was >1 year ago.  Current diet: in general, a "not that great" diet. Current exercise: None. Patient's last menstrual period was 02/22/2018.[name]  Seatbelt? Yes  Health Maintenance Pap/HPV- Yes Tetanus- Yes HIV screening- Yes   Past Medical History:  Diagnosis Date  . Fibroid   . Maureen obesity ([place]) 04/24/2016     Past Surgical History:  Procedure Laterality Date  . NO PAST SURGERIES      Medications  Current Outpatient Medications on File Prior to Visit  Medication Sig Dispense Refill  . aspirin EC 81 MG tablet Take 1 tablet (81 mg total) by mouth daily. Take after 12 weeks for prevention of preeclampsia later in pregnancy 300 tablet 2  . Doxylamine-Pyridoxine (DICLEGIS) 10-10 MG TBEC Take 2 tablets by mouth at bedtime. If symptoms persist, add one tablet in the morning and one in the afternoon 100 tablet 5  . glycopyrrolate (ROBINUL) 2 MG tablet Take 1 tablet (2 mg total) by mouth 3 (three) times daily as needed. 30 tablet 3  . ibuprofen (ADVIL,MOTRIN) 600 MG tablet Take 1 tablet (600 mg total) by mouth every 6 (six) hours. 30 tablet 0  . Prenatal Vit-Fe Fumarate-FA (PRENATAL MULTIVITAMIN) TABS tablet Take 1 tablet by mouth daily at 12 noon.    . promethazine (PHENERGAN) 25 MG tablet Take 1 tablet (25 mg total) by mouth every 6 (six) hours as needed for nausea or vomiting. 30 tablet 1   Allergies No Known Allergies  Review of Systems: Constitutional:  no unexpected weight changes Eye:  no recent significant change in vision Ear/Nose/Mouth/Throat:  Ears:  no tinnitus or vertigo and no recent change in hearing Nose/Mouth/Throat:  no complaints of nasal congestion, no sore throat Cardiovascular: no chest pain Respiratory:  no cough and no shortness of breath Gastrointestinal:  no abdominal pain, no change in bowel habits GU:  Female:  negative for dysuria or pelvic pain Musculoskeletal/Extremities:  no pain of the joints Integumentary (Skin/Breast):  no abnormal skin lesions reported Neurologic:  no headaches Endocrine:  denies fatigue Hematologic/Lymphatic:  No areas of easy bleeding  Exam BP 122/80 (BP Location: Left Arm, Patient Position: Sitting, Cuff Size: Large)   Pulse 76   Temp 98.1 F (36.7 C) (Oral)   Ht 5\' 8"  (1.727 m)   Wt (!) 300 lb 8 oz (136.3 kg)   LMP 02/22/2018   SpO2 98%   BMI 45.69 kg/m  General:  well developed, well nourished, in no apparent distress Skin:  no significant moles, warts, or growths Head:  no masses, lesions, or tenderness Eyes:  pupils equal and round, sclera anicteric without injection Ears:  canals without lesions, TMs shiny without retraction, no obvious effusion, no erythema Nose:  nares patent, septum midline, mucosa normal, and no drainage or sinus tenderness Throat/Pharynx:  lips and gingiva without lesion; tongue and uvula midline; non-inflamed pharynx; no exudates or postnasal drainage Neck: neck supple without adenopathy, thyromegaly, or masses Lungs:  clear to auscultation, breath sounds equal bilaterally, no respiratory distress Cardio:  regular rate and rhythm, no bruits, no LE edema Abdomen:  abdomen soft, nontender; bowel sounds normal; no masses or organomegaly Genital: Defer to GYN Musculoskeletal:  symmetrical muscle groups noted without atrophy or deformity Extremities:  no clubbing, cyanosis, or edema, no deformities, no skin discoloration Neuro:  gait normal; deep tendon reflexes  normal and symmetric Psych: well oriented with normal range of affect and appropriate judgment/insight  Assessment and Plan  Well adult exam - Plan: Lipid panel, Comprehensive metabolic panel  Maureen obesity ([place])   Well 32 y.o. female. Counseled on diet and exercise. She is pregnant.  Other orders as above. Follow up in 1 yr or prn. The patient voiced understanding and  agreement to the plan.  [place], DO 06/08/18 3:56 PM

## 2018-06-08 NOTE — Patient Instructions (Addendum)
-  vitafusion prenatal multivitmain double the dose  -supplement drink if you cannot eat a meal  -chew until appselsauce consistency   -Eat tums daily   -Avoid tomato sauce   -Eat every 3-5 hours 7 days a week; using the meal ideas sheet to put together balanced meals

## 2018-06-09 LAB — COMPREHENSIVE METABOLIC PANEL
ALT: 15 U/L (ref 0–35)
AST: 11 U/L (ref 0–37)
Albumin: 3.9 g/dL (ref 3.5–5.2)
Alkaline Phosphatase: 55 U/L (ref 39–117)
BUN: 6 mg/dL (ref 6–23)
CO2: 25 mEq/L (ref 19–32)
Calcium: 9 mg/dL (ref 8.4–10.5)
Chloride: 101 mEq/L (ref 96–112)
Creatinine, Ser: 0.59 mg/dL (ref 0.40–1.20)
GFR: 151.99 mL/min (ref >60.00)
GLUCOSE: 101 mg/dL — AB (ref 70–99)
Potassium: 3.9 mEq/L (ref 3.5–5.1)
Sodium: 133 mEq/L — ABNORMAL LOW (ref 135–145)
Total Bilirubin: 0.3 mg/dL (ref 0.2–1.2)
Total Protein: 6.9 g/dL (ref 6.0–8.3)

## 2018-06-09 LAB — LIPID PANEL
Cholesterol: 174 mg/dL (ref 0–200)
HDL: 65 mg/dL (ref >39.00)
LDL Cholesterol: 93 mg/dL (ref 0–99)
NonHDL: 109.47
Total CHOL/HDL Ratio: 3
Triglycerides: 82 mg/dL (ref 0.0–149.0)
VLDL: 16.4 mg/dL (ref 0.0–40.0)

## 2018-06-27 ENCOUNTER — Encounter (HOSPITAL_COMMUNITY): Payer: Self-pay

## 2018-07-05 ENCOUNTER — Encounter (HOSPITAL_COMMUNITY): Payer: Self-pay

## 2018-07-05 ENCOUNTER — Ambulatory Visit (HOSPITAL_COMMUNITY)
Admission: RE | Admit: 2018-07-05 | Discharge: 2018-07-05 | Disposition: A | Discharge status: Home / Self Care | Payer: 59 | Source: Ambulatory Visit | Attending: Obstetrics and Gynecology | Admitting: Obstetrics and Gynecology

## 2018-07-05 DIAGNOSIS — O99210 Obesity complicating pregnancy, unspecified trimester: Secondary | ICD-10-CM | POA: Insufficient documentation

## 2018-07-05 DIAGNOSIS — Z3A19 19 weeks gestation of pregnancy: Secondary | ICD-10-CM | POA: Diagnosis not present

## 2018-07-05 DIAGNOSIS — Z3480 Encounter for supervision of other normal pregnancy, unspecified trimester: Secondary | ICD-10-CM | POA: Diagnosis not present

## 2018-07-05 DIAGNOSIS — O09899 Supervision of other high risk pregnancies, unspecified trimester: Secondary | ICD-10-CM | POA: Insufficient documentation

## 2018-07-05 DIAGNOSIS — Z363 Encounter for antenatal screening for malformations: Secondary | ICD-10-CM

## 2018-07-05 DIAGNOSIS — O99212 Obesity complicating pregnancy, second trimester: Secondary | ICD-10-CM

## 2018-07-05 DIAGNOSIS — O3412 Maternal care for benign tumor of corpus uteri, second trimester: Secondary | ICD-10-CM | POA: Diagnosis not present

## 2018-07-06 NOTE — L&D Delivery Note (Signed)
OB/[facility]N Faculty Practice Delivery Note  Maureen Griffin is a 33 y.o. G2P1001 s/p SVD at [redacted]w[redacted]d. She was admitted for induction of labor for chronic HTN with new-onset superimposed preeclampsia, A1GDM.   ROM: 0h 11m with clear fluid GBS Status: negative Maximum Maternal Temperature: Temp (24hrs), Avg:98.8 F (37.1 C), Min:98.8 F (37.1 C), Max:98.8 F (37.1 C)  Labor Progress: . Induction started with foley balloon and cytotec . Epidural placement . Pitocin . AROM just prior to delivery  Delivery Date/Time: 11/21/18 at 1338 Delivery: Called to room and patient was complete and pushing. Head delivered LOA. Loose nuchal cord x 3 reduced at perineum. Shoulder and body delivered in usual fashion. Infant with spontaneous cry, placed on mother's abdomen, dried and stimulated. Cord clamped x 2 after 1-minute delay, and cut by father of baby. Cord blood drawn. Placenta delivered spontaneously with gentle cord traction. Fundus firm with massage and Pitocin. Labia, perineum, vagina, and cervix inspected inspected with 1st degree laceration.   Placenta: spontaneous, intact, 3-vessel cord (to be discarded) Complications: none immediate Lacerations: 1st degree hemostatic EBL: 558cc per triton  Analgesia: epidural   Postpartum Planning [x]  message to sent to schedule follow-up  [/] vaccines UTD - declined Tdap, RI, flu 04/15/18  Infant: Vigorous female  APGARs 8, 9  weight pending but appears AGA  Yidel Teuscher S. Juleen [place], DO OB/GYN Fellow, Faculty Practice

## 2018-07-07 ENCOUNTER — Other Ambulatory Visit: Payer: Self-pay

## 2018-07-07 ENCOUNTER — Encounter: Payer: Self-pay | Admitting: Obstetrics and Gynecology

## 2018-07-07 ENCOUNTER — Other Ambulatory Visit (HOSPITAL_COMMUNITY): Payer: Self-pay | Admitting: *Deleted

## 2018-07-07 ENCOUNTER — Ambulatory Visit (INDEPENDENT_AMBULATORY_CARE_PROVIDER_SITE_OTHER): Payer: 59 | Admitting: Obstetrics and Gynecology

## 2018-07-07 VITALS — BP 122/73 | HR 79 | Wt 304.3 lb

## 2018-07-07 DIAGNOSIS — O99210 Obesity complicating pregnancy, unspecified trimester: Secondary | ICD-10-CM

## 2018-07-07 DIAGNOSIS — O09899 Supervision of other high risk pregnancies, unspecified trimester: Secondary | ICD-10-CM

## 2018-07-07 DIAGNOSIS — Z3482 Encounter for supervision of other normal pregnancy, second trimester: Secondary | ICD-10-CM

## 2018-07-07 DIAGNOSIS — O99212 Obesity complicating pregnancy, second trimester: Secondary | ICD-10-CM

## 2018-07-07 DIAGNOSIS — O09892 Supervision of other high risk pregnancies, second trimester: Secondary | ICD-10-CM

## 2018-07-07 DIAGNOSIS — Z3480 Encounter for supervision of other normal pregnancy, unspecified trimester: Secondary | ICD-10-CM

## 2018-07-07 NOTE — Progress Notes (Signed)
   PRENATAL VISIT NOTE  Subjective:  Maureen Griffin is a 33 y.o. G2P1001 at [redacted]w[redacted]d being seen today for ongoing prenatal care.  She is currently monitored for the following issues for this low-risk pregnancy and has Supervision of other normal pregnancy, antepartum; Short interval between pregnancies affecting pregnancy, antepartum; and Maternal morbid obesity, antepartum ([place]) on their problem list.  Patient reports no complaints.  Contractions: Not present. Vag. Bleeding: None.   . Denies leaking of fluid.   The following portions of the patient's history were reviewed and updated as appropriate: allergies, current medications, past family history, past medical history, past social history, past surgical history and problem list. Problem list updated.  Objective:   Vitals:   07/07/18 1519  BP: 122/73  Pulse: 79  Weight: (!) 304 lb 4.8 oz (138 kg)     Fetal Status: Fetal Heart Rate (bpm): 154         General:  Alert, oriented and cooperative. Patient is in no acute distress.  Skin: Skin is warm and dry. No rash noted.   Cardiovascular: Normal heart rate noted  Respiratory: Normal respiratory effort, no problems with respiration noted  Abdomen: Soft, gravid, appropriate for gestational age.  Pain/Pressure: Absent     Pelvic: Cervical exam deferred        Extremities: Normal range of motion.  Edema: None  Mental Status: Normal mood and affect. Normal behavior. Normal judgment and thought content.   Assessment and Plan:  Pregnancy: G2P1001 at [redacted]w[redacted]d  1. Supervision of other normal pregnancy, antepartum   2. Short interval between pregnancies affecting pregnancy, antepartum  3. Maternal morbid obesity, antepartum ([place]) Repeat growth [place] scheduled for 09/2018  Preterm labor symptoms and general obstetric precautions including but not limited to vaginal bleeding, contractions, leaking of fluid and fetal movement were reviewed in detail with the patient. Please refer to After Visit  Summary for other counseling recommendations.  Return in about 4 weeks (around 08/04/2018) for OB visit.  Future Appointments  Date Time Provider [place]  09/23/2018  3:00 PM [place] 3 [facility]  06/12/2019  2:45 PM [place], [place]by [place], DO [facility]    Sloan Leiter, MD

## 2018-07-07 NOTE — Patient Instructions (Signed)

## 2018-07-15 ENCOUNTER — Encounter: Payer: Self-pay | Admitting: Obstetrics and Gynecology

## 2018-07-15 ENCOUNTER — Other Ambulatory Visit: Payer: Self-pay

## 2018-07-15 DIAGNOSIS — O280 Abnormal hematological finding on antenatal screening of mother: Secondary | ICD-10-CM

## 2018-07-15 LAB — AFP, SERUM, OPEN SPINA BIFIDA
AFP MoM: 5.44
AFP Value: 205.9 ng/mL
Gest. Age on Collection Date: 19.3 weeks
Maternal Age At EDD: 32.9 yr
OSBR Risk 1 IN: 10
Test Results:: POSITIVE — AB
WEIGHT: 304 [lb_av]

## 2018-08-03 ENCOUNTER — Other Ambulatory Visit (HOSPITAL_COMMUNITY): Payer: Self-pay | Admitting: *Deleted

## 2018-08-03 ENCOUNTER — Ambulatory Visit (HOSPITAL_COMMUNITY): Payer: Self-pay | Admitting: Obstetrics and Gynecology

## 2018-08-03 ENCOUNTER — Encounter (HOSPITAL_COMMUNITY): Payer: Self-pay

## 2018-08-03 ENCOUNTER — Ambulatory Visit (HOSPITAL_COMMUNITY): Payer: Self-pay

## 2018-08-03 ENCOUNTER — Ambulatory Visit (HOSPITAL_COMMUNITY)
Admission: RE | Admit: 2018-08-03 | Discharge: 2018-08-03 | Disposition: A | Discharge status: Home / Self Care | Payer: 59 | Source: Ambulatory Visit | Attending: Maternal & Fetal Medicine | Admitting: Maternal & Fetal Medicine

## 2018-08-03 DIAGNOSIS — Z315 Encounter for genetic counseling: Secondary | ICD-10-CM | POA: Diagnosis not present

## 2018-08-03 DIAGNOSIS — R772 Abnormality of alphafetoprotein: Secondary | ICD-10-CM

## 2018-08-03 DIAGNOSIS — O3412 Maternal care for benign tumor of corpus uteri, second trimester: Secondary | ICD-10-CM | POA: Diagnosis not present

## 2018-08-03 DIAGNOSIS — Z3A23 23 weeks gestation of pregnancy: Secondary | ICD-10-CM

## 2018-08-03 DIAGNOSIS — O99212 Obesity complicating pregnancy, second trimester: Secondary | ICD-10-CM

## 2018-08-03 DIAGNOSIS — Z3480 Encounter for supervision of other normal pregnancy, unspecified trimester: Secondary | ICD-10-CM

## 2018-08-03 DIAGNOSIS — O350XX0 Maternal care for (suspected) central nervous system malformation in fetus, not applicable or unspecified: Secondary | ICD-10-CM | POA: Diagnosis not present

## 2018-08-03 DIAGNOSIS — O09899 Supervision of other high risk pregnancies, unspecified trimester: Secondary | ICD-10-CM | POA: Diagnosis not present

## 2018-08-03 DIAGNOSIS — O09892 Supervision of other high risk pregnancies, second trimester: Secondary | ICD-10-CM

## 2018-08-03 DIAGNOSIS — Z362 Encounter for other antenatal screening follow-up: Secondary | ICD-10-CM

## 2018-08-03 DIAGNOSIS — O99210 Obesity complicating pregnancy, unspecified trimester: Secondary | ICD-10-CM

## 2018-08-03 NOTE — ED Notes (Signed)
Pt in with Trixie Rude for genetic counseling.

## 2018-08-04 ENCOUNTER — Encounter: Payer: Self-pay | Admitting: Obstetrics and Gynecology

## 2018-08-04 ENCOUNTER — Ambulatory Visit (INDEPENDENT_AMBULATORY_CARE_PROVIDER_SITE_OTHER): Payer: 59 | Admitting: Obstetrics and Gynecology

## 2018-08-04 VITALS — BP 133/77 | HR 85 | Wt 305.7 lb

## 2018-08-04 DIAGNOSIS — K219 Gastro-esophageal reflux disease without esophagitis: Secondary | ICD-10-CM

## 2018-08-04 DIAGNOSIS — O09899 Supervision of other high risk pregnancies, unspecified trimester: Secondary | ICD-10-CM

## 2018-08-04 DIAGNOSIS — Z3480 Encounter for supervision of other normal pregnancy, unspecified trimester: Secondary | ICD-10-CM

## 2018-08-04 DIAGNOSIS — Z3482 Encounter for supervision of other normal pregnancy, second trimester: Secondary | ICD-10-CM

## 2018-08-04 DIAGNOSIS — R772 Abnormality of alphafetoprotein: Secondary | ICD-10-CM

## 2018-08-04 DIAGNOSIS — O09892 Supervision of other high risk pregnancies, second trimester: Secondary | ICD-10-CM

## 2018-08-04 MED ORDER — PANTOPRAZOLE SODIUM 20 MG PO TBEC: 20.0000 mg | DELAYED_RELEASE_TABLET | Freq: Every day | ORAL | 5 refills | Status: DC

## 2018-08-04 NOTE — Patient Instructions (Signed)

## 2018-08-04 NOTE — Progress Notes (Signed)
Subjective:  Maureen Griffin is a 33 y.o. G2P1001 at [redacted]w[redacted]d being seen today for ongoing prenatal care.  She is currently monitored for the2 following issues for this high-risk pregnancy and has Supervision of other normal pregnancy, antepartum; Elevated AFP; Short interval between pregnancies affecting pregnancy, antepartum; Maternal morbid obesity, antepartum ([place]); and GERD (gastroesophageal reflux disease) on their problem list.  Patient reports heartburn.  Contractions: Not present.  .  Movement: Present. Denies leaking of fluid.   The following portions of the patient's history were reviewed and updated as appropriate: allergies, current medications, past family history, past medical history, past social history, past surgical history and problem list. Problem list updated.  Objective:   Vitals:   08/04/18 1539  BP: 133/77  Pulse: 85  Weight: (!) 305 lb 11.2 oz (138.7 kg)    Fetal Status: Fetal Heart Rate (bpm): 150   Movement: Present     General:  Alert, oriented and cooperative. Patient is in no acute distress.  Skin: Skin is warm and dry. No rash noted.   Cardiovascular: Normal heart rate noted  Respiratory: Normal respiratory effort, no problems with respiration noted  Abdomen: Soft, gravid, appropriate for gestational age. Pain/Pressure: Absent     Pelvic:  Cervical exam deferred        Extremities: Normal range of motion.  Edema: None  Mental Status: Normal mood and affect. Normal behavior. Normal judgment and thought content.   Urinalysis:      Assessment and Plan:  Pregnancy: G2P1001 at [redacted]w[redacted]d  1. Supervision of other normal pregnancy, antepartum Stable [place] next visit  2. Short interval between pregnancies affecting pregnancy, antepartum   3. Elevated AFP LR NIPS Has seen genetics Serial growth scans  4. Gastroesophageal reflux disease without esophagitis  - pantoprazole (PROTONIX) 20 MG tablet; Take 1 tablet (20 mg total) by mouth daily.  Dispense: 30  tablet; Refill: 5  Preterm labor symptoms and general obstetric precautions including but not limited to vaginal bleeding, contractions, leaking of fluid and fetal movement were reviewed in detail with the patient. Please refer to After Visit Summary for other counseling recommendations.  Return in about 4 weeks (around 09/01/2018) for OB visit.   Chancy Milroy, MD

## 2018-08-04 NOTE — Progress Notes (Signed)
Pt presents for ROB c/o heartburn and excessive saliva.

## 2018-08-31 ENCOUNTER — Ambulatory Visit (HOSPITAL_COMMUNITY)
Admission: RE | Admit: 2018-08-31 | Discharge: 2018-08-31 | Disposition: A | Discharge status: Home / Self Care | Payer: 59 | Source: Ambulatory Visit | Attending: Obstetrics and Gynecology | Admitting: Obstetrics and Gynecology

## 2018-08-31 ENCOUNTER — Encounter (HOSPITAL_COMMUNITY): Payer: Self-pay

## 2018-08-31 ENCOUNTER — Ambulatory Visit (HOSPITAL_COMMUNITY): Payer: 59 | Admitting: *Deleted

## 2018-08-31 DIAGNOSIS — O289 Unspecified abnormal findings on antenatal screening of mother: Secondary | ICD-10-CM | POA: Diagnosis not present

## 2018-08-31 DIAGNOSIS — Z362 Encounter for other antenatal screening follow-up: Secondary | ICD-10-CM | POA: Diagnosis not present

## 2018-08-31 DIAGNOSIS — O09892 Supervision of other high risk pregnancies, second trimester: Secondary | ICD-10-CM | POA: Diagnosis not present

## 2018-08-31 DIAGNOSIS — O09899 Supervision of other high risk pregnancies, unspecified trimester: Secondary | ICD-10-CM

## 2018-08-31 DIAGNOSIS — Z3480 Encounter for supervision of other normal pregnancy, unspecified trimester: Secondary | ICD-10-CM | POA: Insufficient documentation

## 2018-08-31 DIAGNOSIS — O280 Abnormal hematological finding on antenatal screening of mother: Secondary | ICD-10-CM | POA: Diagnosis not present

## 2018-08-31 DIAGNOSIS — O3412 Maternal care for benign tumor of corpus uteri, second trimester: Secondary | ICD-10-CM | POA: Diagnosis not present

## 2018-08-31 DIAGNOSIS — O99210 Obesity complicating pregnancy, unspecified trimester: Secondary | ICD-10-CM

## 2018-08-31 DIAGNOSIS — O99212 Obesity complicating pregnancy, second trimester: Secondary | ICD-10-CM | POA: Diagnosis not present

## 2018-08-31 DIAGNOSIS — Z3A27 27 weeks gestation of pregnancy: Secondary | ICD-10-CM

## 2018-09-01 ENCOUNTER — Other Ambulatory Visit (HOSPITAL_COMMUNITY): Payer: Self-pay | Admitting: *Deleted

## 2018-09-01 DIAGNOSIS — R772 Abnormality of alphafetoprotein: Secondary | ICD-10-CM

## 2018-09-02 ENCOUNTER — Encounter: Payer: Self-pay | Admitting: Obstetrics and Gynecology

## 2018-09-02 ENCOUNTER — Other Ambulatory Visit: Payer: 59

## 2018-09-02 ENCOUNTER — Ambulatory Visit (INDEPENDENT_AMBULATORY_CARE_PROVIDER_SITE_OTHER): Payer: 59 | Admitting: Obstetrics and Gynecology

## 2018-09-02 VITALS — BP 142/83 | HR 81 | Wt 307.0 lb

## 2018-09-02 DIAGNOSIS — R772 Abnormality of alphafetoprotein: Secondary | ICD-10-CM

## 2018-09-02 DIAGNOSIS — Z3480 Encounter for supervision of other normal pregnancy, unspecified trimester: Secondary | ICD-10-CM

## 2018-09-02 DIAGNOSIS — Z3A27 27 weeks gestation of pregnancy: Secondary | ICD-10-CM

## 2018-09-02 DIAGNOSIS — O09892 Supervision of other high risk pregnancies, second trimester: Secondary | ICD-10-CM

## 2018-09-02 DIAGNOSIS — Z3482 Encounter for supervision of other normal pregnancy, second trimester: Secondary | ICD-10-CM

## 2018-09-02 DIAGNOSIS — O09899 Supervision of other high risk pregnancies, unspecified trimester: Secondary | ICD-10-CM

## 2018-09-02 NOTE — Patient Instructions (Signed)
Third Trimester of Pregnancy The third trimester is from week 28 through week 40 (months 7 through 9). The third trimester is a time when the unborn baby (fetus) is growing rapidly. At the end of the ninth month, the fetus is about 20 inches in length and weighs 6-10 pounds. Body changes during your third trimester Your body will continue to go through many changes during pregnancy. The changes vary from woman to woman. During the third trimester:  Your weight will continue to increase. You can expect to gain 25-35 pounds (11-16 kg) by the end of the pregnancy.  You may begin to get stretch marks on your hips, abdomen, and breasts.  You may urinate more often because the fetus is moving lower into your pelvis and pressing on your bladder.  You may develop or continue to have heartburn. This is caused by increased hormones that slow down muscles in the digestive tract.  You may develop or continue to have constipation because increased hormones slow digestion and cause the muscles that push waste through your intestines to relax.  You may develop hemorrhoids. These are swollen veins (varicose veins) in the rectum that can itch or be painful.  You may develop swollen, bulging veins (varicose veins) in your legs.  You may have increased body aches in the pelvis, back, or thighs. This is due to weight gain and increased hormones that are relaxing your joints.  You may have changes in your hair. These can include thickening of your hair, rapid growth, and changes in texture. Some women also have hair loss during or after pregnancy, or hair that feels dry or thin. Your hair will most likely return to normal after your baby is born.  Your breasts will continue to grow and they will continue to become tender. A yellow fluid (colostrum) may leak from your breasts. This is the first milk you are producing for your baby.  Your belly button may stick out.  You may notice more swelling in your hands,  face, or ankles.  You may have increased tingling or numbness in your hands, arms, and legs. The skin on your belly may also feel numb.  You may feel short of breath because of your expanding uterus.  You may have more problems sleeping. This can be caused by the size of your belly, increased need to urinate, and an increase in your body's metabolism.  You may notice the fetus "dropping," or moving lower in your abdomen (lightening).  You may have increased vaginal discharge.  You may notice your joints feel loose and you may have pain around your pelvic bone. What to expect at prenatal visits You will have prenatal exams every 2 weeks until week 36. Then you will have weekly prenatal exams. During a routine prenatal visit:  You will be weighed to make sure you and the baby are growing normally.  Your blood pressure will be taken.  Your abdomen will be measured to track your baby's growth.  The fetal heartbeat will be listened to.  Any test results from the previous visit will be discussed.  You may have a cervical check near your due date to see if your cervix has softened or thinned (effaced).  You will be tested for Group B streptococcus. This happens between 35 and 37 weeks. Your health care provider may ask you:  What your birth plan is.  How you are feeling.  If you are feeling the baby move.  If you have had any abnormal   symptoms, such as leaking fluid, bleeding, severe headaches, or abdominal cramping.  If you are using any tobacco products, including cigarettes, chewing tobacco, and electronic cigarettes.  If you have any questions. Other tests or screenings that may be performed during your third trimester include:  Blood tests that check for low iron levels (anemia).  Fetal testing to check the health, activity level, and growth of the fetus. Testing is done if you have certain medical conditions or if there are problems during the pregnancy.  Nonstress test  (NST). This test checks the health of your baby to make sure there are no signs of problems, such as the baby not getting enough oxygen. During this test, a belt is placed around your belly. The baby is made to move, and its heart rate is monitored during movement. What is false labor? False labor is a condition in which you feel small, irregular tightenings of the muscles in the womb (contractions) that usually go away with rest, changing position, or drinking water. These are called Braxton Hicks contractions. Contractions may last for hours, days, or even weeks before true labor sets in. If contractions come at regular intervals, become more frequent, increase in intensity, or become painful, you should see your health care provider. What are the signs of labor?  Abdominal cramps.  Regular contractions that start at 10 minutes apart and become stronger and more frequent with time.  Contractions that start on the top of the uterus and spread down to the lower abdomen and back.  Increased pelvic pressure and dull back pain.  A watery or bloody mucus discharge that comes from the vagina.  Leaking of amniotic fluid. This is also known as your "water breaking." It could be a slow trickle or a gush. Let your health care provider know if it has a color or strange odor. If you have any of these signs, call your health care provider right away, even if it is before your due date. Follow these instructions at home: Medicines  Follow your health care provider's instructions regarding medicine use. Specific medicines may be either safe or unsafe to take during pregnancy.  Take a prenatal vitamin that contains at least 600 micrograms (mcg) of folic acid.  If you develop constipation, try taking a stool softener if your health care provider approves. Eating and drinking   Eat a balanced diet that includes fresh fruits and vegetables, whole grains, good sources of protein such as meat, eggs, or tofu,  and low-fat dairy. Your health care provider will help you determine the amount of weight gain that is right for you.  Avoid raw meat and uncooked cheese. These carry germs that can cause birth defects in the baby.  If you have low calcium intake from food, talk to your health care provider about whether you should take a daily calcium supplement.  Eat four or five small meals rather than three large meals a day.  Limit foods that are high in fat and processed sugars, such as fried and sweet foods.  To prevent constipation: ? Drink enough fluid to keep your urine clear or pale yellow. ? Eat foods that are high in fiber, such as fresh fruits and vegetables, whole grains, and beans. Activity  Exercise only as directed by your health care provider. Most women can continue their usual exercise routine during pregnancy. Try to exercise for 30 minutes at least 5 days a week. Stop exercising if you experience uterine contractions.  Avoid heavy lifting.  Do   not exercise in extreme heat or humidity, or at high altitudes.  Wear low-heel, comfortable shoes.  Practice good posture.  You may continue to have sex unless your health care provider tells you otherwise. Relieving pain and discomfort  Take frequent breaks and rest with your legs elevated if you have leg cramps or low back pain.  Take warm sitz baths to soothe any pain or discomfort caused by hemorrhoids. Use hemorrhoid cream if your health care provider approves.  Wear a good support bra to prevent discomfort from breast tenderness.  If you develop varicose veins: ? Wear support pantyhose or compression stockings as told by your healthcare provider. ? Elevate your feet for 15 minutes, 3-4 times a day. Prenatal care  Write down your questions. Take them to your prenatal visits.  Keep all your prenatal visits as told by your health care provider. This is important. Safety  Wear your seat belt at all times when driving.  Make  a list of emergency phone numbers, including numbers for family, friends, the hospital, and police and fire departments. General instructions  Avoid cat litter boxes and soil used by cats. These carry germs that can cause birth defects in the baby. If you have a cat, ask someone to clean the litter box for you.  Do not travel far distances unless it is absolutely necessary and only with the approval of your health care provider.  Do not use hot tubs, steam rooms, or saunas.  Do not drink alcohol.  Do not use any products that contain nicotine or tobacco, such as cigarettes and e-cigarettes. If you need help quitting, ask your health care provider.  Do not use any medicinal herbs or unprescribed drugs. These chemicals affect the formation and growth of the baby.  Do not douche or use tampons or scented sanitary pads.  Do not cross your legs for long periods of time.  To prepare for the arrival of your baby: ? Take prenatal classes to understand, practice, and ask questions about labor and delivery. ? Make a trial run to the hospital. ? Visit the hospital and tour the maternity area. ? Arrange for maternity or paternity leave through employers. ? Arrange for family and friends to take care of pets while you are in the hospital. ? Purchase a rear-facing car seat and make sure you know how to install it in your car. ? Pack your hospital bag. ? Prepare the baby's nursery. Make sure to remove all pillows and stuffed animals from the baby's crib to prevent suffocation.  Visit your dentist if you have not gone during your pregnancy. Use a soft toothbrush to brush your teeth and be gentle when you floss. Contact a health care provider if:  You are unsure if you are in labor or if your water has broken.  You become dizzy.  You have mild pelvic cramps, pelvic pressure, or nagging pain in your abdominal area.  You have lower back pain.  You have persistent nausea, vomiting, or  diarrhea.  You have an unusual or bad smelling vaginal discharge.  You have pain when you urinate. Get help right away if:  Your water breaks before 37 weeks.  You have regular contractions less than 5 minutes apart before 37 weeks.  You have a fever.  You are leaking fluid from your vagina.  You have spotting or bleeding from your vagina.  You have severe abdominal pain or cramping.  You have rapid weight loss or weight gain.  You have   shortness of breath with chest pain.  You notice sudden or extreme swelling of your face, hands, ankles, feet, or legs.  Your baby makes fewer than 10 movements in 2 hours.  You have severe headaches that do not go away when you take medicine.  You have vision changes. Summary  The third trimester is from week 28 through week 40, months 7 through 9. The third trimester is a time when the unborn baby (fetus) is growing rapidly.  During the third trimester, your discomfort may increase as you and your baby continue to gain weight. You may have abdominal, leg, and back pain, sleeping problems, and an increased need to urinate.  During the third trimester your breasts will keep growing and they will continue to become tender. A yellow fluid (colostrum) may leak from your breasts. This is the first milk you are producing for your baby.  False labor is a condition in which you feel small, irregular tightenings of the muscles in the womb (contractions) that eventually go away. These are called Braxton Hicks contractions. Contractions may last for hours, days, or even weeks before true labor sets in.  Signs of labor can include: abdominal cramps; regular contractions that start at 10 minutes apart and become stronger and more frequent with time; watery or bloody mucus discharge that comes from the vagina; increased pelvic pressure and dull back pain; and leaking of amniotic fluid. This information is not intended to replace advice given to you by your  health care provider. Make sure you discuss any questions you have with your health care provider. Document Released: 06/16/2001 Document Revised: 07/28/2016 Document Reviewed: 07/28/2016 [employer] Interactive Patient Education  2019 [employer]  

## 2018-09-02 NOTE — Progress Notes (Signed)
Subjective:  Maureen Griffin is a 33 y.o. G2P1001 at [redacted]w[redacted]d being seen today for ongoing prenatal care.  She is currently monitored for the following issues for this high-risk pregnancy and has Supervision of other normal pregnancy, antepartum; Elevated AFP; Short interval between pregnancies affecting pregnancy, antepartum; Maternal morbid obesity, antepartum ([place]); and GERD (gastroesophageal reflux disease) on their problem list.  Patient reports no complaints.  Contractions: Not present. Vag. Bleeding: None.  Movement: Present. Denies leaking of fluid.   The following portions of the patient's history were reviewed and updated as appropriate: allergies, current medications, past family history, past medical history, past social history, past surgical history and problem list. Problem list updated.  Objective:   Vitals:   09/02/18 0842  BP: (!) 142/83  Pulse: 81  Weight: (!) 307 lb (139.3 kg)    Fetal Status: Fetal Heart Rate (bpm): 150   Movement: Present     General:  Alert, oriented and cooperative. Patient is in no acute distress.  Skin: Skin is warm and dry. No rash noted.   Cardiovascular: Normal heart rate noted  Respiratory: Normal respiratory effort, no problems with respiration noted  Abdomen: Soft, gravid, appropriate for gestational age. Pain/Pressure: Present     Pelvic:  Cervical exam deferred        Extremities: Normal range of motion.  Edema: None  Mental Status: Normal mood and affect. Normal behavior. Normal judgment and thought content.   Urinalysis:      Assessment and Plan:  Pregnancy: G2P1001 at [redacted]w[redacted]d  1. Supervision of other normal pregnancy, antepartum Stable - Glucose Tolerance, 2 Hours w/1 Hour - CBC - HIV Antibody (routine testing w rflx) - RPR  2. Short interval between pregnancies affecting pregnancy, antepartum   3. Elevated AFP LR NIPS Serial growth scan Appropriate interval growth on scan yesterday  Preterm labor symptoms and general  obstetric precautions including but not limited to vaginal bleeding, contractions, leaking of fluid and fetal movement were reviewed in detail with the patient. Please refer to After Visit Summary for other counseling recommendations.  Return in about 4 weeks (around 09/30/2018) for OB visit.   Chancy Milroy, MD

## 2018-09-02 NOTE — Progress Notes (Signed)
k

## 2018-09-03 LAB — CBC
Hematocrit: 38.1 % (ref 34.0–46.6)
Hemoglobin: 12.7 g/dL (ref 11.1–15.9)
MCH: 29.1 pg (ref 26.6–33.0)
MCHC: 33.3 g/dL (ref 31.5–35.7)
MCV: 87 fL (ref 79–97)
Platelets: 277 10*3/uL (ref 150–450)
RBC: 4.37 x10E6/uL (ref 3.77–5.28)
RDW: 14 % (ref 11.7–15.4)
WBC: 9.5 10*3/uL (ref 3.4–10.8)

## 2018-09-03 LAB — GLUCOSE TOLERANCE, 2 HOURS W/ 1HR
Glucose, 1 hour: 122 mg/dL (ref 65–179)
Glucose, 2 hour: 101 mg/dL (ref 65–152)
Glucose, Fasting: 94 mg/dL — ABNORMAL HIGH (ref 65–91)

## 2018-09-03 LAB — RPR: RPR Ser Ql: NONREACTIVE

## 2018-09-03 LAB — HIV ANTIBODY (ROUTINE TESTING W REFLEX): HIV Screen 4th Generation wRfx: NONREACTIVE

## 2018-09-07 ENCOUNTER — Other Ambulatory Visit: Payer: Self-pay

## 2018-09-07 DIAGNOSIS — O24419 Gestational diabetes mellitus in pregnancy, unspecified control: Secondary | ICD-10-CM

## 2018-09-07 MED ORDER — BLOOD GLUCOSE MONITORING SUPPL W/DEVICE KIT: PACK | 0 refills | Status: DC

## 2018-09-07 MED ORDER — ACCU-CHEK FASTCLIX LANCETS MISC: 1.0000 [IU] | Freq: Four times a day (QID) | 12 refills | Status: DC

## 2018-09-07 MED ORDER — GLUCOSE BLOOD VI STRP: ORAL_STRIP | 12 refills | Status: DC

## 2018-09-19 ENCOUNTER — Other Ambulatory Visit: Payer: Self-pay

## 2018-09-19 ENCOUNTER — Encounter: Payer: Self-pay | Admitting: Obstetrics and Gynecology

## 2018-09-19 ENCOUNTER — Ambulatory Visit (INDEPENDENT_AMBULATORY_CARE_PROVIDER_SITE_OTHER): Payer: 59 | Admitting: Obstetrics and Gynecology

## 2018-09-19 VITALS — BP 151/85 | HR 80 | Wt 308.0 lb

## 2018-09-19 DIAGNOSIS — O99210 Obesity complicating pregnancy, unspecified trimester: Principal | ICD-10-CM

## 2018-09-19 DIAGNOSIS — O99213 Obesity complicating pregnancy, third trimester: Secondary | ICD-10-CM

## 2018-09-19 DIAGNOSIS — O09893 Supervision of other high risk pregnancies, third trimester: Secondary | ICD-10-CM

## 2018-09-19 DIAGNOSIS — O10913 Unspecified pre-existing hypertension complicating pregnancy, third trimester: Secondary | ICD-10-CM

## 2018-09-19 DIAGNOSIS — O0990 Supervision of high risk pregnancy, unspecified, unspecified trimester: Secondary | ICD-10-CM

## 2018-09-19 DIAGNOSIS — O10919 Unspecified pre-existing hypertension complicating pregnancy, unspecified trimester: Secondary | ICD-10-CM | POA: Insufficient documentation

## 2018-09-19 DIAGNOSIS — O09899 Supervision of other high risk pregnancies, unspecified trimester: Secondary | ICD-10-CM

## 2018-09-19 DIAGNOSIS — Z3A29 29 weeks gestation of pregnancy: Secondary | ICD-10-CM

## 2018-09-19 DIAGNOSIS — O24419 Gestational diabetes mellitus in pregnancy, unspecified control: Secondary | ICD-10-CM

## 2018-09-19 MED ORDER — LABETALOL HCL 200 MG PO TABS: 200.0000 mg | ORAL_TABLET | Freq: Two times a day (BID) | ORAL | 3 refills | Status: DC

## 2018-09-19 NOTE — Progress Notes (Signed)
[redacted]w[redacted]d.

## 2018-09-19 NOTE — Progress Notes (Signed)
   PRENATAL VISIT NOTE  Subjective:  Maureen Griffin is a 33 y.o. G2P1001 at [redacted]w[redacted]d being seen today for ongoing prenatal care.  She is currently monitored for the following issues for this high-risk pregnancy and has Supervision of other normal pregnancy, antepartum; Elevated AFP; Short interval between pregnancies affecting pregnancy, antepartum; Maternal morbid obesity, antepartum ([place]); GERD (gastroesophageal reflux disease); Chronic hypertension during pregnancy, antepartum; and Gestational diabetes mellitus (GDM) affecting pregnancy, antepartum on their problem list.  Patient reports occasional heart palpitations. She denies chest pain or SOB.  Contractions: Not present. Vag. Bleeding: None.  Movement: Present. Denies leaking of fluid.   The following portions of the patient's history were reviewed and updated as appropriate: allergies, current medications, past family history, past medical history, past social history, past surgical history and problem list.   Objective:   Vitals:   09/19/18 1555 09/19/18 1558  BP: (!) 143/87 (!) 151/85  Pulse: 83 80  Weight: (!) 308 lb (139.7 kg)     Fetal Status: Fetal Heart Rate (bpm): 151 Fundal Height: 30 cm Movement: Present     General:  Alert, oriented and cooperative. Patient is in no acute distress.  Skin: Skin is warm and dry. No rash noted.   Cardiovascular: Normal heart rate noted  Respiratory: Normal respiratory effort, no problems with respiration noted  Abdomen: Soft, gravid, appropriate for gestational age.  Pain/Pressure: Absent     Pelvic: Cervical exam deferred        Extremities: Normal range of motion.  Edema: None  Mental Status: Normal mood and affect. Normal behavior. Normal judgment and thought content.   Assessment and Plan:  Pregnancy: G2P1001 at [redacted]w[redacted]d 1. Supervision of other normal pregnancy, antepartum Patient is doing well Advised to remain well hydrated  2. Maternal morbid obesity, antepartum ([place])   3.  Short interval between pregnancies affecting pregnancy, antepartum   4. Chronic hypertension during pregnancy, antepartum Patient with elevated BP Labetalol 200 mg BID started Continue ASA Follow up growth ultrasound  5. Gestational diabetes mellitus (GDM) affecting pregnancy, antepartum Patient with failed 2 hour glucola Patient is scheduled to meet with diabetic educator this week Discussed importance of glycemic control during pregnancy to improve maternal fetal outcome  Preterm labor symptoms and general obstetric precautions including but not limited to vaginal bleeding, contractions, leaking of fluid and fetal movement were reviewed in detail with the patient. Please refer to After Visit Summary for other counseling recommendations.   Return in about 2 weeks (around 10/03/2018) for [place].  Future Appointments  Date Time Provider [place]  09/21/2018  3:15 PM [place] GDM CLASS NDM-[facility] NDM  09/29/2018  3:30 PM [place] [facility]  09/29/2018  3:45 PM [place] 2 WH-MFCUS MFC-US  10/03/2018  4:15 PM Rylan Bernard, Vickii Chafe, MD [place] None  06/12/2019  2:45 PM Wendling, Crosby Oyster, DO [facility]    Mora Bellman, MD

## 2018-09-19 NOTE — Progress Notes (Signed)
ROB. Patient declined TDAP.  BP elevated,  C/o heart palpitations; denies HA, blurry vision, [place].

## 2018-09-21 ENCOUNTER — Encounter: Payer: 59 | Attending: Obstetrics and Gynecology | Admitting: Registered"

## 2018-09-21 ENCOUNTER — Other Ambulatory Visit: Payer: Self-pay

## 2018-09-21 ENCOUNTER — Ambulatory Visit: Payer: 59

## 2018-09-21 DIAGNOSIS — O99810 Abnormal glucose complicating pregnancy: Secondary | ICD-10-CM | POA: Insufficient documentation

## 2018-09-23 ENCOUNTER — Encounter: Payer: Self-pay | Admitting: Registered"

## 2018-09-23 ENCOUNTER — Ambulatory Visit (HOSPITAL_COMMUNITY): Payer: 59

## 2018-09-23 DIAGNOSIS — O99810 Abnormal glucose complicating pregnancy: Secondary | ICD-10-CM | POA: Insufficient documentation

## 2018-09-23 NOTE — Progress Notes (Signed)
Patient was seen on 09/21/18 for Gestational Diabetes self-management class at the [facility]. The following learning objectives were met by the patient during this course:   States the definition of Gestational Diabetes  States why dietary management is important in controlling blood glucose  Describes the effects each nutrient has on blood glucose levels  Demonstrates ability to create a balanced meal plan  Demonstrates carbohydrate counting   States when to check blood glucose levels  Demonstrates proper blood glucose monitoring techniques  States the effect of stress and exercise on blood glucose levels  States the importance of limiting caffeine and abstaining from alcohol and smoking  Blood glucose monitor given: none   Patient instructed to monitor glucose levels: FBS: 60 - <95; 1 hour: <140; 2 hour: <120  Patient received handouts:  Nutrition Diabetes and Pregnancy, including carb counting list  Patient will be seen for follow-up as needed.

## 2018-09-29 ENCOUNTER — Other Ambulatory Visit: Payer: Self-pay

## 2018-09-29 ENCOUNTER — Ambulatory Visit (HOSPITAL_COMMUNITY)
Admission: RE | Admit: 2018-09-29 | Discharge: 2018-09-29 | Disposition: A | Discharge status: Home / Self Care | Payer: 59 | Source: Ambulatory Visit | Attending: Obstetrics and Gynecology | Admitting: Obstetrics and Gynecology

## 2018-09-29 ENCOUNTER — Encounter (HOSPITAL_COMMUNITY): Payer: Self-pay

## 2018-09-29 ENCOUNTER — Ambulatory Visit (HOSPITAL_COMMUNITY): Payer: 59 | Admitting: *Deleted

## 2018-09-29 VITALS — BP 151/75 | HR 79 | Temp 98.5°F

## 2018-09-29 DIAGNOSIS — O24419 Gestational diabetes mellitus in pregnancy, unspecified control: Secondary | ICD-10-CM | POA: Insufficient documentation

## 2018-09-29 DIAGNOSIS — Z3A31 31 weeks gestation of pregnancy: Secondary | ICD-10-CM

## 2018-09-29 DIAGNOSIS — O289 Unspecified abnormal findings on antenatal screening of mother: Secondary | ICD-10-CM

## 2018-09-29 DIAGNOSIS — O0990 Supervision of high risk pregnancy, unspecified, unspecified trimester: Secondary | ICD-10-CM | POA: Diagnosis not present

## 2018-09-29 DIAGNOSIS — O99210 Obesity complicating pregnancy, unspecified trimester: Secondary | ICD-10-CM | POA: Diagnosis not present

## 2018-09-29 DIAGNOSIS — O10013 Pre-existing essential hypertension complicating pregnancy, third trimester: Secondary | ICD-10-CM

## 2018-09-29 DIAGNOSIS — Z362 Encounter for other antenatal screening follow-up: Secondary | ICD-10-CM

## 2018-09-29 DIAGNOSIS — R772 Abnormality of alphafetoprotein: Secondary | ICD-10-CM | POA: Diagnosis not present

## 2018-09-29 DIAGNOSIS — O09893 Supervision of other high risk pregnancies, third trimester: Secondary | ICD-10-CM

## 2018-09-29 DIAGNOSIS — O99213 Obesity complicating pregnancy, third trimester: Secondary | ICD-10-CM | POA: Diagnosis not present

## 2018-09-29 DIAGNOSIS — O24410 Gestational diabetes mellitus in pregnancy, diet controlled: Secondary | ICD-10-CM

## 2018-09-29 DIAGNOSIS — O285 Abnormal chromosomal and genetic finding on antenatal screening of mother: Secondary | ICD-10-CM | POA: Insufficient documentation

## 2018-09-29 DIAGNOSIS — O3413 Maternal care for benign tumor of corpus uteri, third trimester: Secondary | ICD-10-CM | POA: Diagnosis not present

## 2018-09-29 DIAGNOSIS — O10919 Unspecified pre-existing hypertension complicating pregnancy, unspecified trimester: Secondary | ICD-10-CM | POA: Insufficient documentation

## 2018-09-29 DIAGNOSIS — O09899 Supervision of other high risk pregnancies, unspecified trimester: Secondary | ICD-10-CM | POA: Diagnosis not present

## 2018-09-29 NOTE — Progress Notes (Signed)
Pt states she took labetalol 200 mg one time on 09/19/18 but discontinued after that due to side effects.  Pt states she had an increase in edema to her feet, overall fatigue &  muscle weakness. Pt hasn't been taking anything else for her HTN.  Next appt with [facility] on 10/03/18, pt to discuss with her provider.

## 2018-09-30 ENCOUNTER — Other Ambulatory Visit (HOSPITAL_COMMUNITY): Payer: Self-pay | Admitting: *Deleted

## 2018-09-30 ENCOUNTER — Other Ambulatory Visit: Payer: Self-pay | Admitting: Obstetrics and Gynecology

## 2018-09-30 ENCOUNTER — Telehealth: Payer: Self-pay

## 2018-09-30 DIAGNOSIS — O10913 Unspecified pre-existing hypertension complicating pregnancy, third trimester: Secondary | ICD-10-CM

## 2018-09-30 MED ORDER — METHYLDOPA 250 MG PO TABS: 250.0000 mg | ORAL_TABLET | Freq: Two times a day (BID) | ORAL | 3 refills | Status: DC

## 2018-09-30 NOTE — Telephone Encounter (Signed)
Return call to patient requesting different B/P medication (labetatol)  Pt states w/ current medication she has constant fatigue, swelling in her feet, and muscle weakness. Pt denies any chest discomfort, no SOB no HA's. Pt made aware I would make her request known to a provider.

## 2018-09-30 NOTE — Telephone Encounter (Signed)
Attempt to reach pt to make aware Pt not ava vm full  I sent pt a MyChart message.

## 2018-10-03 ENCOUNTER — Encounter: Payer: 59 | Admitting: Obstetrics and Gynecology

## 2018-10-10 ENCOUNTER — Other Ambulatory Visit: Payer: Self-pay

## 2018-10-10 ENCOUNTER — Ambulatory Visit (INDEPENDENT_AMBULATORY_CARE_PROVIDER_SITE_OTHER): Payer: 59 | Admitting: Advanced Practice Midwife

## 2018-10-10 VITALS — BP 149/84 | HR 85 | Wt 306.0 lb

## 2018-10-10 DIAGNOSIS — Z3A32 32 weeks gestation of pregnancy: Secondary | ICD-10-CM

## 2018-10-10 DIAGNOSIS — O10919 Unspecified pre-existing hypertension complicating pregnancy, unspecified trimester: Secondary | ICD-10-CM

## 2018-10-10 DIAGNOSIS — O24419 Gestational diabetes mellitus in pregnancy, unspecified control: Secondary | ICD-10-CM

## 2018-10-10 DIAGNOSIS — O10913 Unspecified pre-existing hypertension complicating pregnancy, third trimester: Secondary | ICD-10-CM

## 2018-10-10 DIAGNOSIS — O0990 Supervision of high risk pregnancy, unspecified, unspecified trimester: Secondary | ICD-10-CM

## 2018-10-10 MED ORDER — METHYLDOPA 250 MG PO TABS: 500.0000 mg | ORAL_TABLET | Freq: Two times a day (BID) | ORAL | 3 refills | Status: DC

## 2018-10-10 NOTE — Progress Notes (Signed)
   PRENATAL VISIT NOTE  Subjective:  Maureen Griffin is a 33 y.o. G2P1001 at [redacted]w[redacted]d being seen today for ongoing prenatal care.  She is currently monitored for the following issues for this high-risk pregnancy and has Supervision of high risk pregnancy, antepartum; Elevated AFP; Short interval between pregnancies affecting pregnancy, antepartum; Maternal morbid obesity, antepartum ([place]); GERD (gastroesophageal reflux disease); Chronic hypertension during pregnancy, antepartum; Gestational diabetes mellitus (GDM) affecting pregnancy, antepartum; and Abnormal glucose tolerance test (GTT) during pregnancy, antepartum on their problem list.  Patient reports no complaints.  Contractions: Not present. Vag. Bleeding: None.  Movement: Present. Denies leaking of fluid.   The following portions of the patient's history were reviewed and updated as appropriate: allergies, current medications, past family history, past medical history, past social history, past surgical history and problem list.   Objective:   Vitals:   10/10/18 1553  BP: (!) 149/84  Pulse: 85  Weight: (!) 138.8 kg    Fetal Status: Fetal Heart Rate (bpm): 145   Movement: Present     General:  Alert, oriented and cooperative. Patient is in no acute distress.  Skin: Skin is warm and dry. No rash noted.   Cardiovascular: Normal heart rate noted  Respiratory: Normal respiratory effort, no problems with respiration noted  Abdomen: Soft, gravid, appropriate for gestational age.  Pain/Pressure: Absent     Pelvic: Cervical exam deferred        Extremities: Normal range of motion.     Mental Status: Normal mood and affect. Normal behavior. Normal judgment and thought content.   Assessment and Plan:  Pregnancy: G2P1001 at [redacted]w[redacted]d 1. Supervision of high risk pregnancy, antepartum --Anticipatory guidance about next visits/weeks of pregnancy given. --Reviewed safety, visitor policy, reassurance about COVID-19 for pregnancy at this time.  Discussed possible changes to visits, including televisits, that may occur due to COVID-19.  The office remains open if pt needs to be seen and [facility]U is open 24 hours/day for OB emergencies. --Phone visit in 1 week for check in on glucose values --Next prenatal visit in the office in 2 weeks for BP check    2. Chronic hypertension during pregnancy, antepartum --BP elevated today 149/84.  No s/sx of PEC.   --Increase Aldomet to 500 BID --Reviewed symptoms of PEC, reasons to seek care  3. Gestational diabetes mellitus (GDM) affecting pregnancy, antepartum --Pt without log but reports most fastings 104-106, few under 95.  She reports 3 PP in last 2 weeks above 120, almost all of those are within normal range.   --Discussed diet and pt eating late dinner, high carbohydrates, no snack at bedtime.  Discussed changes to diet.   --Plan to talk with pt in 1 week as phone visit to evaluate glucose, add Metformin QHS PRN   Preterm labor symptoms and general obstetric precautions including but not limited to vaginal bleeding, contractions, leaking of fluid and fetal movement were reviewed in detail with the patient. Please refer to After Visit Summary for other counseling recommendations.   Return in about 2 weeks (around 10/24/2018).  Future Appointments  Date Time Provider [place]  10/25/2018  3:15 PM Chancy Milroy, MD [place] None  10/27/2018  3:45 PM [place] NURSE [place] [facility]-US  10/27/2018  3:45 PM [place] 2 WH-MFCUS MFC-US  06/12/2019  2:45 PM Wendling, Crosby Oyster, DO [facility]    Fatima Blank, CNM

## 2018-10-25 ENCOUNTER — Ambulatory Visit (INDEPENDENT_AMBULATORY_CARE_PROVIDER_SITE_OTHER): Payer: 59 | Admitting: Obstetrics and Gynecology

## 2018-10-25 ENCOUNTER — Other Ambulatory Visit: Payer: Self-pay

## 2018-10-25 ENCOUNTER — Encounter: Payer: Self-pay | Admitting: Obstetrics and Gynecology

## 2018-10-25 VITALS — BP 140/85 | HR 80 | Wt 307.0 lb

## 2018-10-25 DIAGNOSIS — O10919 Unspecified pre-existing hypertension complicating pregnancy, unspecified trimester: Secondary | ICD-10-CM

## 2018-10-25 DIAGNOSIS — Z3A35 35 weeks gestation of pregnancy: Secondary | ICD-10-CM

## 2018-10-25 DIAGNOSIS — Z113 Encounter for screening for infections with a predominantly sexual mode of transmission: Secondary | ICD-10-CM

## 2018-10-25 DIAGNOSIS — O10913 Unspecified pre-existing hypertension complicating pregnancy, third trimester: Secondary | ICD-10-CM

## 2018-10-25 DIAGNOSIS — R772 Abnormality of alphafetoprotein: Secondary | ICD-10-CM

## 2018-10-25 DIAGNOSIS — O0990 Supervision of high risk pregnancy, unspecified, unspecified trimester: Secondary | ICD-10-CM | POA: Diagnosis not present

## 2018-10-25 DIAGNOSIS — O0993 Supervision of high risk pregnancy, unspecified, third trimester: Secondary | ICD-10-CM

## 2018-10-25 DIAGNOSIS — O24419 Gestational diabetes mellitus in pregnancy, unspecified control: Secondary | ICD-10-CM

## 2018-10-25 MED ORDER — METFORMIN HCL 500 MG PO TABS: 500.0000 mg | ORAL_TABLET | Freq: Two times a day (BID) | ORAL | 5 refills | Status: DC

## 2018-10-25 NOTE — Progress Notes (Signed)
Subjective:  Maureen Griffin is a 33 y.o. G2P1001 at [redacted]w[redacted]d being seen today for ongoing prenatal care.  She is currently monitored for the following issues for this high-risk pregnancy and has Supervision of high risk pregnancy, antepartum; Elevated AFP; Short interval between pregnancies affecting pregnancy, antepartum; Maternal morbid obesity, antepartum ([place]); GERD (gastroesophageal reflux disease); Chronic hypertension during pregnancy, antepartum; Gestational diabetes mellitus (GDM) affecting pregnancy, antepartum; and Abnormal glucose tolerance test (GTT) during pregnancy, antepartum on their problem list.  Patient reports no complaints.  Contractions: Not present. Vag. Bleeding: None.  Movement: Present. Denies leaking of fluid.   The following portions of the patient's history were reviewed and updated as appropriate: allergies, current medications, past family history, past medical history, past social history, past surgical history and problem list. Problem list updated.  Objective:   Vitals:   10/25/18 1505  BP: 140/85  Pulse: 80  Weight: (!) 307 lb (139.3 kg)    Fetal Status: Fetal Heart Rate (bpm): 142   Movement: Present     General:  Alert, oriented and cooperative. Patient is in no acute distress.  Skin: Skin is warm and dry. No rash noted.   Cardiovascular: Normal heart rate noted  Respiratory: Normal respiratory effort, no problems with respiration noted  Abdomen: Soft, gravid, appropriate for gestational age. Pain/Pressure: Present     Pelvic:  Cervical exam performed        Extremities: Normal range of motion.  Edema: Trace  Mental Status: Normal mood and affect. Normal behavior. Normal judgment and thought content.   Urinalysis:      Assessment and Plan:  Pregnancy: G2P1001 at [redacted]w[redacted]d  1. Supervision of high risk pregnancy, antepartum Stable Information on [place] provided to pt - Strep Gp B NAA - GC/Chlamydia probe amp ([place])not at [facility]  2. Chronic  hypertension during pregnancy, antepartum BP stable Continue with Aldomet U/S on Thursday BP cuff provided to pt.  3. Gestational diabetes mellitus (GDM) affecting pregnancy, antepartum Did not bring CBG log. Reports fasting CBG's  100-110, occ 2 hr pp elevated Will start Metformin U/S on Thursday - metFORMIN (GLUCOPHAGE) 500 MG tablet; Take 1 tablet (500 mg total) by mouth 2 (two) times daily with a meal.  Dispense: 60 tablet; Refill: 5  4. Elevated AFP NIPS LF Growth scan this Thursday  Preterm labor symptoms and general obstetric precautions including but not limited to vaginal bleeding, contractions, leaking of fluid and fetal movement were reviewed in detail with the patient. Please refer to After Visit Summary for other counseling recommendations.  Return in about 2 weeks (around 11/08/2018) for OB visit, televisit.   Chancy Milroy, MD

## 2018-10-25 NOTE — Progress Notes (Signed)
Pt denies any HA's or visual changes.  Pt forgot to bring log of sugars.

## 2018-10-25 NOTE — Patient Instructions (Signed)
Vaginal Delivery  Vaginal delivery means that you give birth by pushing your baby out of your birth canal (vagina). A team of health care providers will help you before, during, and after vaginal delivery. Birth experiences are unique for every woman and every pregnancy, and birth experiences vary depending on where you choose to give birth. What happens when I arrive at the birth center or hospital? Once you are in labor and have been admitted into the hospital or birth center, your health care provider may:  Review your pregnancy history and any concerns that you have.  Insert an IV into one of your veins. This may be used to give you fluids and medicines.  Check your blood pressure, pulse, temperature, and heart rate (vital signs).  Check whether your bag of water (amniotic sac) has broken (ruptured).  Talk with you about your birth plan and discuss pain control options. Monitoring Your health care provider may monitor your contractions (uterine monitoring) and your baby's heart rate (fetal monitoring). You may need to be monitored:  Often, but not continuously (intermittently).  All the time or for long periods at a time (continuously). Continuous monitoring may be needed if: ? You are taking certain medicines, such as medicine to relieve pain or make your contractions stronger. ? You have pregnancy or labor complications. Monitoring may be done by:  Placing a special stethoscope or a handheld monitoring device on your abdomen to check your baby's heartbeat and to check for contractions.  Placing monitors on your abdomen (external monitors) to record your baby's heartbeat and the frequency and length of contractions.  Placing monitors inside your uterus through your vagina (internal monitors) to record your baby's heartbeat and the frequency, length, and strength of your contractions. Depending on the type of monitor, it may remain in your uterus or on your baby's head until birth.   Telemetry. This is a type of continuous monitoring that can be done with external or internal monitors. Instead of having to stay in bed, you are able to move around during telemetry. Physical exam Your health care provider may perform frequent physical exams. This may include:  Checking how and where your baby is positioned in your uterus.  Checking your cervix to determine: ? Whether it is thinning out (effacing). ? Whether it is opening up (dilating). What happens during labor and delivery?  Normal labor and delivery is divided into the following three stages: Stage 1  This is the longest stage of labor.  This stage can last for hours or days.  Throughout this stage, you will feel contractions. Contractions generally feel mild, infrequent, and irregular at first. They get stronger, more frequent (about every 2-3 minutes), and more regular as you move through this stage.  This stage ends when your cervix is completely dilated to 4 inches (10 cm) and completely effaced. Stage 2  This stage starts once your cervix is completely effaced and dilated and lasts until the delivery of your baby.  This stage may last from 20 minutes to 2 hours.  This is the stage where you will feel an urge to push your baby out of your vagina.  You may feel stretching and burning pain, especially when the widest part of your baby's head passes through the vaginal opening (crowning).  Once your baby is delivered, the umbilical cord will be clamped and cut. This usually occurs after waiting a period of 1-2 minutes after delivery.  Your baby will be placed on your bare chest (  skin-to-skin contact) in an upright position and covered with a warm blanket. Watch your baby for feeding cues, like rooting or sucking, and help the baby to your breast for his or her first feeding. Stage 3  This stage starts immediately after the birth of your baby and ends after you deliver the placenta.  This stage may take  anywhere from 5 to 30 minutes.  After your baby has been delivered, you will feel contractions as your body expels the placenta and your uterus contracts to control bleeding. What can I expect after labor and delivery?  After labor is over, you and your baby will be monitored closely until you are ready to go home to ensure that you are both healthy. Your health care team will teach you how to care for yourself and your baby.  You and your baby will stay in the same room (rooming in) during your hospital stay. This will encourage early bonding and successful breastfeeding.  You may continue to receive fluids and medicines through an IV.  Your uterus will be checked and massaged regularly (fundal massage).  You will have some soreness and pain in your abdomen, vagina, and the area of skin between your vaginal opening and your anus (perineum).  If an incision was made near your vagina (episiotomy) or if you had some vaginal tearing during delivery, cold compresses may be placed on your episiotomy or your tear. This helps to reduce pain and swelling.  You may be given a squirt bottle to use instead of wiping when you go to the bathroom. To use the squirt bottle, follow these steps: ? Before you urinate, fill the squirt bottle with warm water. Do not use hot water. ? After you urinate, while you are sitting on the toilet, use the squirt bottle to rinse the area around your urethra and vaginal opening. This rinses away any urine and blood. ? Fill the squirt bottle with clean water every time you use the bathroom.  It is normal to have vaginal bleeding after delivery. Wear a sanitary pad for vaginal bleeding and discharge. Summary  Vaginal delivery means that you will give birth by pushing your baby out of your birth canal (vagina).  Your health care provider may monitor your contractions (uterine monitoring) and your baby's heart rate (fetal monitoring).  Your health care provider may perform  a physical exam.  Normal labor and delivery is divided into three stages.  After labor is over, you and your baby will be monitored closely until you are ready to go home. This information is not intended to replace advice given to you by your health care provider. Make sure you discuss any questions you have with your health care provider. Document Released: 03/31/2008 Document Revised: 07/27/2017 Document Reviewed: 07/27/2017 Elsevier Interactive Patient Education  2019 [place] of Pregnancy The third trimester is from week 28 through week 40 (months 7 through 9). The third trimester is a time when the unborn baby (fetus) is growing rapidly. At the end of the ninth month, the fetus is about 20 inches in length and weighs 6-10 pounds. Body changes during your third trimester Your body will continue to go through many changes during pregnancy. The changes vary from woman to woman. During the third trimester:  Your weight will continue to increase. You can expect to gain 25-35 pounds (11-16 kg) by the end of the pregnancy.  You may begin to get stretch marks on your hips, abdomen, and breasts.  You may urinate more often because the fetus is moving lower into your pelvis and pressing on your bladder.  You may develop or continue to have heartburn. This is caused by increased hormones that slow down muscles in the digestive tract.  You may develop or continue to have constipation because increased hormones slow digestion and cause the muscles that push waste through your intestines to relax.  You may develop hemorrhoids. These are swollen veins (varicose veins) in the rectum that can itch or be painful.  You may develop swollen, bulging veins (varicose veins) in your legs.  You may have increased body aches in the pelvis, back, or thighs. This is due to weight gain and increased hormones that are relaxing your joints.  You may have changes in your hair. These can  include thickening of your hair, rapid growth, and changes in texture. Some women also have hair loss during or after pregnancy, or hair that feels dry or thin. Your hair will most likely return to normal after your baby is born.  Your breasts will continue to grow and they will continue to become tender. A yellow fluid (colostrum) may leak from your breasts. This is the first milk you are producing for your baby.  Your belly button may stick out.  You may notice more swelling in your hands, face, or ankles.  You may have increased tingling or numbness in your hands, arms, and legs. The skin on your belly may also feel numb.  You may feel short of breath because of your expanding uterus.  You may have more problems sleeping. This can be caused by the size of your belly, increased need to urinate, and an increase in your body's metabolism.  You may notice the fetus "dropping," or moving lower in your abdomen (lightening).  You may have increased vaginal discharge.  You may notice your joints feel loose and you may have pain around your pelvic bone. What to expect at prenatal visits You will have prenatal exams every 2 weeks until week 36. Then you will have weekly prenatal exams. During a routine prenatal visit:  You will be weighed to make sure you and the baby are growing normally.  Your blood pressure will be taken.  Your abdomen will be measured to track your baby's growth.  The fetal heartbeat will be listened to.  Any test results from the previous visit will be discussed.  You may have a cervical check near your due date to see if your cervix has softened or thinned (effaced).  You will be tested for Group B streptococcus. This happens between 35 and 37 weeks. Your health care provider may ask you:  What your birth plan is.  How you are feeling.  If you are feeling the baby move.  If you have had any abnormal symptoms, such as leaking fluid, bleeding, severe  headaches, or abdominal cramping.  If you are using any tobacco products, including cigarettes, chewing tobacco, and electronic cigarettes.  If you have any questions. Other tests or screenings that may be performed during your third trimester include:  Blood tests that check for low iron levels (anemia).  Fetal testing to check the health, activity level, and growth of the fetus. Testing is done if you have certain medical conditions or if there are problems during the pregnancy.  Nonstress test (NST). This test checks the health of your baby to make sure there are no signs of problems, such as the baby not getting enough oxygen.  During this test, a belt is placed around your belly. The baby is made to move, and its heart rate is monitored during movement. What is false labor? False labor is a condition in which you feel small, irregular tightenings of the muscles in the womb (contractions) that usually go away with rest, changing position, or drinking water. These are called Braxton Hicks contractions. Contractions may last for hours, days, or even weeks before true labor sets in. If contractions come at regular intervals, become more frequent, increase in intensity, or become painful, you should see your health care provider. What are the signs of labor?  Abdominal cramps.  Regular contractions that start at 10 minutes apart and become stronger and more frequent with time.  Contractions that start on the top of the uterus and spread down to the lower abdomen and back.  Increased pelvic pressure and dull back pain.  A watery or bloody mucus discharge that comes from the vagina.  Leaking of amniotic fluid. This is also known as your "water breaking." It could be a slow trickle or a gush. Let your health care provider know if it has a color or strange odor. If you have any of these signs, call your health care provider right away, even if it is before your due date. Follow these  instructions at home: Medicines  Follow your health care provider's instructions regarding medicine use. Specific medicines may be either safe or unsafe to take during pregnancy.  Take a prenatal vitamin that contains at least 600 micrograms (mcg) of folic acid.  If you develop constipation, try taking a stool softener if your health care provider approves. Eating and drinking   Eat a balanced diet that includes fresh fruits and vegetables, whole grains, good sources of protein such as meat, eggs, or tofu, and low-fat dairy. Your health care provider will help you determine the amount of weight gain that is right for you.  Avoid raw meat and uncooked cheese. These carry germs that can cause birth defects in the baby.  If you have low calcium intake from food, talk to your health care provider about whether you should take a daily calcium supplement.  Eat four or five small meals rather than three large meals a day.  Limit foods that are high in fat and processed sugars, such as fried and sweet foods.  To prevent constipation: ? Drink enough fluid to keep your urine clear or pale yellow. ? Eat foods that are high in fiber, such as fresh fruits and vegetables, whole grains, and beans. Activity  Exercise only as directed by your health care provider. Most women can continue their usual exercise routine during pregnancy. Try to exercise for 30 minutes at least 5 days a week. Stop exercising if you experience uterine contractions.  Avoid heavy lifting.  Do not exercise in extreme heat or humidity, or at high altitudes.  Wear low-heel, comfortable shoes.  Practice good posture.  You may continue to have sex unless your health care provider tells you otherwise. Relieving pain and discomfort  Take frequent breaks and rest with your legs elevated if you have leg cramps or low back pain.  Take warm sitz baths to soothe any pain or discomfort caused by hemorrhoids. Use hemorrhoid cream  if your health care provider approves.  Wear a good support bra to prevent discomfort from breast tenderness.  If you develop varicose veins: ? Wear support pantyhose or compression stockings as told by your healthcare provider. ? Elevate your  feet for 15 minutes, 3-4 times a day. Prenatal care  Write down your questions. Take them to your prenatal visits.  Keep all your prenatal visits as told by your health care provider. This is important. Safety  Wear your seat belt at all times when driving.  Make a list of emergency phone numbers, including numbers for family, friends, the hospital, and police and fire departments. General instructions  Avoid cat litter boxes and soil used by cats. These carry germs that can cause birth defects in the baby. If you have a cat, ask someone to clean the litter box for you.  Do not travel far distances unless it is absolutely necessary and only with the approval of your health care provider.  Do not use hot tubs, steam rooms, or saunas.  Do not drink alcohol.  Do not use any products that contain nicotine or tobacco, such as cigarettes and e-cigarettes. If you need help quitting, ask your health care provider.  Do not use any medicinal herbs or unprescribed drugs. These chemicals affect the formation and growth of the baby.  Do not douche or use tampons or scented sanitary pads.  Do not cross your legs for long periods of time.  To prepare for the arrival of your baby: ? Take prenatal classes to understand, practice, and ask questions about labor and delivery. ? Make a trial run to the hospital. ? Visit the hospital and tour the maternity area. ? Arrange for maternity or paternity leave through employers. ? Arrange for family and friends to take care of pets while you are in the hospital. ? Purchase a rear-facing car seat and make sure you know how to install it in your car. ? Pack your hospital bag. ? Prepare the baby's nursery. Make  sure to remove all pillows and stuffed animals from the baby's crib to prevent suffocation.  Visit your dentist if you have not gone during your pregnancy. Use a soft toothbrush to brush your teeth and be gentle when you floss. Contact a health care provider if:  You are unsure if you are in labor or if your water has broken.  You become dizzy.  You have mild pelvic cramps, pelvic pressure, or nagging pain in your abdominal area.  You have lower back pain.  You have persistent nausea, vomiting, or diarrhea.  You have an unusual or bad smelling vaginal discharge.  You have pain when you urinate. Get help right away if:  Your water breaks before 37 weeks.  You have regular contractions less than 5 minutes apart before 37 weeks.  You have a fever.  You are leaking fluid from your vagina.  You have spotting or bleeding from your vagina.  You have severe abdominal pain or cramping.  You have rapid weight loss or weight gain.  You have shortness of breath with chest pain.  You notice sudden or extreme swelling of your face, hands, ankles, feet, or legs.  Your baby makes fewer than 10 movements in 2 hours.  You have severe headaches that do not go away when you take medicine.  You have vision changes. Summary  The third trimester is from week 28 through week 40, months 7 through 9. The third trimester is a time when the unborn baby (fetus) is growing rapidly.  During the third trimester, your discomfort may increase as you and your baby continue to gain weight. You may have abdominal, leg, and back pain, sleeping problems, and an increased need to urinate.  During the third trimester your breasts will keep growing and they will continue to become tender. A yellow fluid (colostrum) may leak from your breasts. This is the first milk you are producing for your baby.  False labor is a condition in which you feel small, irregular tightenings of the muscles in the womb  (contractions) that eventually go away. These are called Braxton Hicks contractions. Contractions may last for hours, days, or even weeks before true labor sets in.  Signs of labor can include: abdominal cramps; regular contractions that start at 10 minutes apart and become stronger and more frequent with time; watery or bloody mucus discharge that comes from the vagina; increased pelvic pressure and dull back pain; and leaking of amniotic fluid. This information is not intended to replace advice given to you by your health care provider. Make sure you discuss any questions you have with your health care provider. Document Released: 06/16/2001 Document Revised: 07/28/2016 Document Reviewed: 07/28/2016 Elsevier Interactive Patient Education  2019 [employer].

## 2018-10-26 LAB — GC/CHLAMYDIA PROBE AMP (~~LOC~~) NOT AT ARMC
Chlamydia: NEGATIVE
Neisseria Gonorrhea: NEGATIVE

## 2018-10-27 ENCOUNTER — Ambulatory Visit (HOSPITAL_COMMUNITY): Payer: 59 | Admitting: *Deleted

## 2018-10-27 ENCOUNTER — Encounter (HOSPITAL_COMMUNITY): Payer: Self-pay

## 2018-10-27 ENCOUNTER — Other Ambulatory Visit: Payer: Self-pay

## 2018-10-27 ENCOUNTER — Ambulatory Visit (HOSPITAL_COMMUNITY)
Admission: RE | Admit: 2018-10-27 | Discharge: 2018-10-27 | Disposition: A | Discharge status: Home / Self Care | Payer: 59 | Source: Ambulatory Visit | Attending: Obstetrics and Gynecology | Admitting: Obstetrics and Gynecology

## 2018-10-27 VITALS — BP 140/80 | HR 74 | Temp 99.0°F

## 2018-10-27 DIAGNOSIS — O24415 Gestational diabetes mellitus in pregnancy, controlled by oral hypoglycemic drugs: Secondary | ICD-10-CM

## 2018-10-27 DIAGNOSIS — O99210 Obesity complicating pregnancy, unspecified trimester: Secondary | ICD-10-CM | POA: Insufficient documentation

## 2018-10-27 DIAGNOSIS — Z362 Encounter for other antenatal screening follow-up: Secondary | ICD-10-CM

## 2018-10-27 DIAGNOSIS — O10013 Pre-existing essential hypertension complicating pregnancy, third trimester: Secondary | ICD-10-CM

## 2018-10-27 DIAGNOSIS — Z3A35 35 weeks gestation of pregnancy: Secondary | ICD-10-CM

## 2018-10-27 DIAGNOSIS — O280 Abnormal hematological finding on antenatal screening of mother: Secondary | ICD-10-CM

## 2018-10-27 DIAGNOSIS — O3413 Maternal care for benign tumor of corpus uteri, third trimester: Secondary | ICD-10-CM | POA: Diagnosis not present

## 2018-10-27 DIAGNOSIS — O09899 Supervision of other high risk pregnancies, unspecified trimester: Secondary | ICD-10-CM | POA: Insufficient documentation

## 2018-10-27 DIAGNOSIS — O289 Unspecified abnormal findings on antenatal screening of mother: Secondary | ICD-10-CM

## 2018-10-27 DIAGNOSIS — O0990 Supervision of high risk pregnancy, unspecified, unspecified trimester: Secondary | ICD-10-CM | POA: Insufficient documentation

## 2018-10-27 DIAGNOSIS — Z3483 Encounter for supervision of other normal pregnancy, third trimester: Secondary | ICD-10-CM

## 2018-10-27 DIAGNOSIS — O24419 Gestational diabetes mellitus in pregnancy, unspecified control: Secondary | ICD-10-CM

## 2018-10-27 DIAGNOSIS — O10913 Unspecified pre-existing hypertension complicating pregnancy, third trimester: Secondary | ICD-10-CM | POA: Diagnosis not present

## 2018-10-27 DIAGNOSIS — O10919 Unspecified pre-existing hypertension complicating pregnancy, unspecified trimester: Secondary | ICD-10-CM | POA: Diagnosis not present

## 2018-10-27 DIAGNOSIS — O99213 Obesity complicating pregnancy, third trimester: Secondary | ICD-10-CM | POA: Diagnosis not present

## 2018-10-27 LAB — STREP GP B NAA: Strep Gp B NAA: NEGATIVE

## 2018-10-28 ENCOUNTER — Other Ambulatory Visit (HOSPITAL_COMMUNITY): Payer: Self-pay | Admitting: *Deleted

## 2018-10-28 DIAGNOSIS — O10913 Unspecified pre-existing hypertension complicating pregnancy, third trimester: Secondary | ICD-10-CM

## 2018-11-03 ENCOUNTER — Encounter (HOSPITAL_COMMUNITY): Payer: Self-pay

## 2018-11-03 ENCOUNTER — Ambulatory Visit (HOSPITAL_COMMUNITY): Payer: 59

## 2018-11-08 ENCOUNTER — Ambulatory Visit (INDEPENDENT_AMBULATORY_CARE_PROVIDER_SITE_OTHER): Payer: 59 | Admitting: Obstetrics and Gynecology

## 2018-11-08 ENCOUNTER — Other Ambulatory Visit: Payer: Self-pay

## 2018-11-08 ENCOUNTER — Encounter: Payer: Self-pay | Admitting: Obstetrics and Gynecology

## 2018-11-08 VITALS — BP 152/90 | HR 76

## 2018-11-08 DIAGNOSIS — Z3A37 37 weeks gestation of pregnancy: Secondary | ICD-10-CM

## 2018-11-08 DIAGNOSIS — O10919 Unspecified pre-existing hypertension complicating pregnancy, unspecified trimester: Secondary | ICD-10-CM

## 2018-11-08 DIAGNOSIS — O10913 Unspecified pre-existing hypertension complicating pregnancy, third trimester: Secondary | ICD-10-CM

## 2018-11-08 DIAGNOSIS — O0990 Supervision of high risk pregnancy, unspecified, unspecified trimester: Secondary | ICD-10-CM

## 2018-11-08 DIAGNOSIS — O0993 Supervision of high risk pregnancy, unspecified, third trimester: Secondary | ICD-10-CM

## 2018-11-08 DIAGNOSIS — O24419 Gestational diabetes mellitus in pregnancy, unspecified control: Secondary | ICD-10-CM

## 2018-11-08 NOTE — Progress Notes (Signed)
Pt presents for a webex visit. Pt identified with two patient identifiers. She is [redacted]w[redacted]d. Her bp today is 152/90. Pt denies having any headache, swelling, visual changes, or epigastric pain. Pt states that she is only taking 250 mg of the methyldopa twice a day. She states that when she takes the 500 mg twice a day her feet swells. Pt has no other concerns.

## 2018-11-08 NOTE — Progress Notes (Signed)
   TELEHEALTH VIRTUAL OBSTETRICS PRENATAL VISIT ENCOUNTER NOTE  I connected with Maureen Griffin on 11/08/18 at  4:00 PM EDT by WebEx at home and verified that I am speaking with the correct person using two identifiers.   I discussed the limitations, risks, security and privacy concerns of performing an evaluation and management service by telephone and the availability of in person appointments. I also discussed with the patient that there may be a patient responsible charge related to this service. The patient expressed understanding and agreed to proceed. Subjective:  Maureen Griffin is a 33 y.o. G2P1001 at [redacted]w[redacted]d being seen today for ongoing prenatal care.  She is currently monitored for the following issues for this high-risk pregnancy and has Supervision of high risk pregnancy, antepartum; Elevated AFP; Short interval between pregnancies affecting pregnancy, antepartum; Maternal morbid obesity, antepartum ([place]); GERD (gastroesophageal reflux disease); Chronic hypertension during pregnancy, antepartum; Gestational diabetes mellitus (GDM) affecting pregnancy, antepartum; and Abnormal glucose tolerance test (GTT) during pregnancy, antepartum on their problem list.  Patient reports general discomforts of pregnancy. Denies HA or visual changes.  Reports fetal movement. Contractions: Not present. Vag. Bleeding: None.  Movement: Present. Denies any contractions, bleeding or leaking of fluid.   The following portions of the patient's history were reviewed and updated as appropriate: allergies, current medications, past family history, past medical history, past social history, past surgical history and problem list.   Objective:   Vitals:   11/08/18 1618  BP: (!) 152/90  Pulse: 76    Fetal Status:     Movement: Present     General:  Alert, oriented and cooperative. Patient is in no acute distress.  Respiratory: Normal respiratory effort, no problems with respiration noted  Mental Status: Normal  mood and affect. Normal behavior. Normal judgment and thought content.  Rest of physical exam deferred due to type of encounter  Assessment and Plan:  Pregnancy: G2P1001 at [redacted]w[redacted]d 1. Supervision of high risk pregnancy, antepartum Stable Vaginal cultures next visit  2. Chronic hypertension during pregnancy, antepartum BP as noted. Not taking Aldomet 500 mg bid, only 250 mg  Pt instructed to increase Aldomet to 500 mg bid No S/Sx of PEC S/Sx of PEC reviewed BP monitoring reviewed U/S Thursday  3. Gestational diabetes mellitus (GDM) affecting pregnancy, antepartum Reports CBG's in goal range Continue with Metformin U/S Thursday  Preterm labor symptoms and general obstetric precautions including but not limited to vaginal bleeding, contractions, leaking of fluid and fetal movement were reviewed in detail with the patient. I discussed the assessment and treatment plan with the patient. The patient was provided an opportunity to ask questions and all were answered. The patient agreed with the plan and demonstrated an understanding of the instructions. The patient was advised to call back or seek an in-person office evaluation/go to [facility] at [facility] for any urgent or concerning symptoms. Please refer to After Visit Summary for other counseling recommendations.   I provided 11 minutes of face-to-face via WebEx time during this encounter.  Return in about 1 year (around 11/08/2019) for OB visit, face to face for GBS.  Future Appointments  Date Time Provider [place]  11/10/2018  3:00 PM [place] [facility]  11/10/2018  3:00 PM [place] [facility]  06/12/2019  2:45 PM [place], [place]by Oyster, DO [facility] [place], [place] for [facility], [place] [facility]

## 2018-11-10 ENCOUNTER — Other Ambulatory Visit: Payer: Self-pay

## 2018-11-10 ENCOUNTER — Ambulatory Visit (HOSPITAL_COMMUNITY)
Admission: RE | Admit: 2018-11-10 | Discharge: 2018-11-10 | Disposition: A | Discharge status: Home / Self Care | Payer: 59 | Source: Ambulatory Visit | Attending: Obstetrics and Gynecology | Admitting: Obstetrics and Gynecology

## 2018-11-10 ENCOUNTER — Other Ambulatory Visit: Payer: Self-pay | Admitting: *Deleted

## 2018-11-10 ENCOUNTER — Ambulatory Visit (HOSPITAL_COMMUNITY): Payer: 59 | Admitting: *Deleted

## 2018-11-10 ENCOUNTER — Encounter (HOSPITAL_COMMUNITY): Payer: Self-pay | Admitting: *Deleted

## 2018-11-10 ENCOUNTER — Other Ambulatory Visit (HOSPITAL_COMMUNITY): Payer: Self-pay | Admitting: *Deleted

## 2018-11-10 DIAGNOSIS — O3413 Maternal care for benign tumor of corpus uteri, third trimester: Secondary | ICD-10-CM

## 2018-11-10 DIAGNOSIS — O10013 Pre-existing essential hypertension complicating pregnancy, third trimester: Secondary | ICD-10-CM | POA: Diagnosis not present

## 2018-11-10 DIAGNOSIS — O24419 Gestational diabetes mellitus in pregnancy, unspecified control: Secondary | ICD-10-CM | POA: Insufficient documentation

## 2018-11-10 DIAGNOSIS — O99210 Obesity complicating pregnancy, unspecified trimester: Secondary | ICD-10-CM | POA: Diagnosis not present

## 2018-11-10 DIAGNOSIS — O289 Unspecified abnormal findings on antenatal screening of mother: Secondary | ICD-10-CM | POA: Diagnosis not present

## 2018-11-10 DIAGNOSIS — O09899 Supervision of other high risk pregnancies, unspecified trimester: Secondary | ICD-10-CM

## 2018-11-10 DIAGNOSIS — Z3A37 37 weeks gestation of pregnancy: Secondary | ICD-10-CM | POA: Diagnosis not present

## 2018-11-10 DIAGNOSIS — O10919 Unspecified pre-existing hypertension complicating pregnancy, unspecified trimester: Secondary | ICD-10-CM

## 2018-11-10 DIAGNOSIS — O99213 Obesity complicating pregnancy, third trimester: Secondary | ICD-10-CM

## 2018-11-10 DIAGNOSIS — O0990 Supervision of high risk pregnancy, unspecified, unspecified trimester: Secondary | ICD-10-CM | POA: Diagnosis not present

## 2018-11-10 DIAGNOSIS — O24415 Gestational diabetes mellitus in pregnancy, controlled by oral hypoglycemic drugs: Secondary | ICD-10-CM

## 2018-11-10 DIAGNOSIS — O10913 Unspecified pre-existing hypertension complicating pregnancy, third trimester: Secondary | ICD-10-CM | POA: Diagnosis not present

## 2018-11-10 MED ORDER — NIFEDIPINE ER OSMOTIC RELEASE 30 MG PO TB24: 30.0000 mg | ORAL_TABLET | Freq: Every day | ORAL | 2 refills | Status: DC

## 2018-11-10 MED FILL — METHYLDOPA 250 MG TAB: 250 | 15 days supply | Qty: 60 | Fill #0

## 2018-11-10 NOTE — Progress Notes (Signed)
Spoke with pt regarding BP med- aldomet. Medication is currently on backorder and pharmacy doesn't know when they will be able to get it.  Discussed with Dr Rosana Hoes, Procardia XL 30mg  to be sent to pharmacy. Pt made aware.

## 2018-11-17 ENCOUNTER — Telehealth (HOSPITAL_COMMUNITY): Payer: Self-pay | Admitting: *Deleted

## 2018-11-17 ENCOUNTER — Encounter: Payer: Self-pay | Admitting: Obstetrics and Gynecology

## 2018-11-17 ENCOUNTER — Other Ambulatory Visit: Payer: Self-pay

## 2018-11-17 ENCOUNTER — Ambulatory Visit (HOSPITAL_COMMUNITY)
Admission: RE | Admit: 2018-11-17 | Discharge: 2018-11-17 | Disposition: A | Discharge status: Home / Self Care | Payer: 59 | Source: Ambulatory Visit | Attending: Obstetrics and Gynecology | Admitting: Obstetrics and Gynecology

## 2018-11-17 ENCOUNTER — Ambulatory Visit (INDEPENDENT_AMBULATORY_CARE_PROVIDER_SITE_OTHER): Payer: 59 | Admitting: Obstetrics and Gynecology

## 2018-11-17 ENCOUNTER — Ambulatory Visit (HOSPITAL_COMMUNITY): Payer: 59 | Admitting: *Deleted

## 2018-11-17 ENCOUNTER — Encounter (HOSPITAL_COMMUNITY): Payer: Self-pay

## 2018-11-17 VITALS — BP 139/79 | HR 81 | Temp 98.7°F

## 2018-11-17 VITALS — BP 136/82 | HR 102 | Wt 307.0 lb

## 2018-11-17 DIAGNOSIS — O24419 Gestational diabetes mellitus in pregnancy, unspecified control: Secondary | ICD-10-CM | POA: Insufficient documentation

## 2018-11-17 DIAGNOSIS — Z3A38 38 weeks gestation of pregnancy: Secondary | ICD-10-CM

## 2018-11-17 DIAGNOSIS — O10919 Unspecified pre-existing hypertension complicating pregnancy, unspecified trimester: Secondary | ICD-10-CM | POA: Insufficient documentation

## 2018-11-17 DIAGNOSIS — O09899 Supervision of other high risk pregnancies, unspecified trimester: Secondary | ICD-10-CM | POA: Diagnosis not present

## 2018-11-17 DIAGNOSIS — O99213 Obesity complicating pregnancy, third trimester: Secondary | ICD-10-CM | POA: Diagnosis not present

## 2018-11-17 DIAGNOSIS — O10013 Pre-existing essential hypertension complicating pregnancy, third trimester: Secondary | ICD-10-CM

## 2018-11-17 DIAGNOSIS — O24415 Gestational diabetes mellitus in pregnancy, controlled by oral hypoglycemic drugs: Secondary | ICD-10-CM | POA: Diagnosis not present

## 2018-11-17 DIAGNOSIS — O289 Unspecified abnormal findings on antenatal screening of mother: Secondary | ICD-10-CM

## 2018-11-17 DIAGNOSIS — O3413 Maternal care for benign tumor of corpus uteri, third trimester: Secondary | ICD-10-CM | POA: Diagnosis not present

## 2018-11-17 DIAGNOSIS — O0990 Supervision of high risk pregnancy, unspecified, unspecified trimester: Secondary | ICD-10-CM | POA: Insufficient documentation

## 2018-11-17 DIAGNOSIS — O99210 Obesity complicating pregnancy, unspecified trimester: Secondary | ICD-10-CM | POA: Diagnosis not present

## 2018-11-17 NOTE — Progress Notes (Signed)
Subjective:  Maureen Griffin is a 33 y.o. G2P1001 at [redacted]w[redacted]d being seen today for ongoing prenatal care.  She is currently monitored for the following issues for this high-risk pregnancy and has Supervision of high risk pregnancy, antepartum; Elevated AFP; Short interval between pregnancies affecting pregnancy, antepartum; Maternal morbid obesity, antepartum ([place]); GERD (gastroesophageal reflux disease); Chronic hypertension during pregnancy, antepartum; Gestational diabetes mellitus (GDM) affecting pregnancy, antepartum; and Abnormal glucose tolerance test (GTT) during pregnancy, antepartum on their problem list.  Patient reports general discomforts of pregnancy.  Contractions: Not present. Vag. Bleeding: None.  Movement: Present. Denies leaking of fluid.   The following portions of the patient's history were reviewed and updated as appropriate: allergies, current medications, past family history, past medical history, past social history, past surgical history and problem list. Problem list updated.  Objective:   Vitals:   11/17/18 1042  BP: 136/82  Pulse: (!) 102  Weight: (!) 307 lb (139.3 kg)    Fetal Status: Fetal Heart Rate (bpm): 139   Movement: Present     General:  Alert, oriented and cooperative. Patient is in no acute distress.  Skin: Skin is warm and dry. No rash noted.   Cardiovascular: Normal heart rate noted  Respiratory: Normal respiratory effort, no problems with respiration noted  Abdomen: Soft, gravid, appropriate for gestational age. Pain/Pressure: Absent     Pelvic:  Cervical exam performed        Extremities: Normal range of motion.  Edema: None  Mental Status: Normal mood and affect. Normal behavior. Normal judgment and thought content.   Urinalysis:      Assessment and Plan:  Pregnancy: G2P1001 at 108w2d  1. Supervision of high risk pregnancy, antepartum Stable Labor Precautions  2. Chronic hypertension during pregnancy, antepartum BP stable Now on Procardia,  d/t inability to obtain [place] No S/Sx of PEC For BBP today IOL next week at 39 weeks IOL schedule full until 11/26/18 Will place on waiting list and f/u in 1 week for OB visit  3. Gestational diabetes mellitus (GDM) affecting pregnancy, antepartum CBG's in goal range Continue with Metfromin  Term labor symptoms and general obstetric precautions including but not limited to vaginal bleeding, contractions, leaking of fluid and fetal movement were reviewed in detail with the patient. Please refer to After Visit Summary for other counseling recommendations.  Return for 1 week from 11/22/18 for BP check, 4 weeks from 11/22/18 for PP visit.   Chancy Milroy, MD

## 2018-11-17 NOTE — Telephone Encounter (Signed)
Preadmission screen  

## 2018-11-17 NOTE — Patient Instructions (Signed)

## 2018-11-18 ENCOUNTER — Other Ambulatory Visit (HOSPITAL_COMMUNITY): Payer: Self-pay | Admitting: *Deleted

## 2018-11-18 ENCOUNTER — Telehealth (HOSPITAL_COMMUNITY): Payer: Self-pay | Admitting: *Deleted

## 2018-11-18 ENCOUNTER — Encounter (HOSPITAL_COMMUNITY): Payer: Self-pay | Admitting: *Deleted

## 2018-11-18 DIAGNOSIS — O10913 Unspecified pre-existing hypertension complicating pregnancy, third trimester: Secondary | ICD-10-CM

## 2018-11-18 NOTE — Telephone Encounter (Signed)
Preadmission screen  

## 2018-11-21 ENCOUNTER — Inpatient Hospital Stay (HOSPITAL_COMMUNITY): Payer: 59 | Admitting: Anesthesiology

## 2018-11-21 ENCOUNTER — Encounter (HOSPITAL_COMMUNITY): Payer: Self-pay

## 2018-11-21 ENCOUNTER — Other Ambulatory Visit: Payer: Self-pay

## 2018-11-21 ENCOUNTER — Inpatient Hospital Stay (HOSPITAL_COMMUNITY)
Admission: AD | Admit: 2018-11-21 | Discharge: 2018-11-23 | DRG: 807 | Disposition: A | Discharge status: Home / Self Care | Payer: 59 | Attending: Obstetrics & Gynecology | Admitting: Obstetrics & Gynecology

## 2018-11-21 DIAGNOSIS — Z3A38 38 weeks gestation of pregnancy: Secondary | ICD-10-CM

## 2018-11-21 DIAGNOSIS — Z880 Allergy status to penicillin: Secondary | ICD-10-CM

## 2018-11-21 DIAGNOSIS — O99810 Abnormal glucose complicating pregnancy: Secondary | ICD-10-CM | POA: Diagnosis present

## 2018-11-21 DIAGNOSIS — O24425 Gestational diabetes mellitus in childbirth, controlled by oral hypoglycemic drugs: Secondary | ICD-10-CM | POA: Diagnosis present

## 2018-11-21 DIAGNOSIS — O24429 Gestational diabetes mellitus in childbirth, unspecified control: Secondary | ICD-10-CM | POA: Diagnosis not present

## 2018-11-21 DIAGNOSIS — K219 Gastro-esophageal reflux disease without esophagitis: Secondary | ICD-10-CM | POA: Diagnosis present

## 2018-11-21 DIAGNOSIS — O114 Pre-existing hypertension with pre-eclampsia, complicating childbirth: Secondary | ICD-10-CM | POA: Diagnosis present

## 2018-11-21 DIAGNOSIS — O10919 Unspecified pre-existing hypertension complicating pregnancy, unspecified trimester: Secondary | ICD-10-CM | POA: Diagnosis present

## 2018-11-21 DIAGNOSIS — R030 Elevated blood-pressure reading, without diagnosis of hypertension: Secondary | ICD-10-CM | POA: Diagnosis present

## 2018-11-21 DIAGNOSIS — O9962 Diseases of the digestive system complicating childbirth: Secondary | ICD-10-CM | POA: Diagnosis present

## 2018-11-21 DIAGNOSIS — Z1159 Encounter for screening for other viral diseases: Secondary | ICD-10-CM

## 2018-11-21 DIAGNOSIS — O24419 Gestational diabetes mellitus in pregnancy, unspecified control: Secondary | ICD-10-CM | POA: Diagnosis present

## 2018-11-21 DIAGNOSIS — O164 Unspecified maternal hypertension, complicating childbirth: Secondary | ICD-10-CM | POA: Diagnosis not present

## 2018-11-21 DIAGNOSIS — R772 Abnormality of alphafetoprotein: Secondary | ICD-10-CM | POA: Diagnosis present

## 2018-11-21 DIAGNOSIS — O2492 Unspecified diabetes mellitus in childbirth: Secondary | ICD-10-CM | POA: Diagnosis not present

## 2018-11-21 DIAGNOSIS — O1002 Pre-existing essential hypertension complicating childbirth: Secondary | ICD-10-CM | POA: Diagnosis not present

## 2018-11-21 DIAGNOSIS — O99214 Obesity complicating childbirth: Secondary | ICD-10-CM | POA: Diagnosis present

## 2018-11-21 DIAGNOSIS — O09899 Supervision of other high risk pregnancies, unspecified trimester: Secondary | ICD-10-CM

## 2018-11-21 LAB — CBC
HCT: 37.9 % (ref 36.0–46.0)
HCT: 37.3 % (ref 36.0–46.0)
HCT: 38.9 % (ref 36.0–46.0)
Hemoglobin: 13.5 g/dL (ref 12.0–15.0)
Hemoglobin: 13.5 g/dL (ref 12.0–15.0)
Hemoglobin: 14.1 g/dL (ref 12.0–15.0)
MCH: 29.5 pg (ref 26.0–34.0)
MCH: 30 pg (ref 26.0–34.0)
MCH: 30.2 pg (ref 26.0–34.0)
MCHC: 35.6 g/dL (ref 30.0–36.0)
MCHC: 36.2 g/dL — ABNORMAL HIGH (ref 30.0–36.0)
MCHC: 36.2 g/dL — ABNORMAL HIGH (ref 30.0–36.0)
MCV: 82.9 fL (ref 80.0–100.0)
MCV: 82.9 fL (ref 80.0–100.0)
MCV: 83.3 fL (ref 80.0–100.0)
Platelets: 229 10*3/uL (ref 150–400)
Platelets: 236 10*3/uL (ref 150–400)
Platelets: 251 10*3/uL (ref 150–400)
RBC: 4.57 MIL/uL (ref 3.87–5.11)
RBC: 4.5 MIL/uL (ref 3.87–5.11)
RBC: 4.67 MIL/uL (ref 3.87–5.11)
RDW: 13.9 % (ref 11.5–15.5)
RDW: 13.9 % (ref 11.5–15.5)
RDW: 13.9 % (ref 11.5–15.5)
WBC: 9.9 10*3/uL (ref 4.0–10.5)
WBC: 9.7 10*3/uL (ref 4.0–10.5)
WBC: 12.8 10*3/uL — ABNORMAL HIGH (ref 4.0–10.5)
nRBC: 0 % (ref 0.0–0.2)
nRBC: 0 % (ref 0.0–0.2)
nRBC: 0 % (ref 0.0–0.2)

## 2018-11-21 LAB — COMPREHENSIVE METABOLIC PANEL
ALT: 23 U/L (ref 0–44)
AST: 23 U/L (ref 15–41)
Albumin: 2.8 g/dL — ABNORMAL LOW (ref 3.5–5.0)
Alkaline Phosphatase: 262 U/L — ABNORMAL HIGH (ref 38–126)
Anion gap: 10 (ref 5–15)
BUN: 6 mg/dL (ref 6–20)
CO2: 21 mmol/L — ABNORMAL LOW (ref 22–32)
Calcium: 9 mg/dL (ref 8.9–10.3)
Chloride: 104 mmol/L (ref 98–111)
Creatinine, Ser: 0.52 mg/dL (ref 0.44–1.00)
GFR calc Af Amer: >60 mL/min (ref >60)
GFR calc non Af Amer: >60 mL/min (ref >60)
Glucose, Bld: 98 mg/dL (ref 70–99)
Potassium: 3.9 mmol/L (ref 3.5–5.1)
Sodium: 135 mmol/L (ref 135–145)
Total Bilirubin: 0.6 mg/dL (ref 0.3–1.2)
Total Protein: 6.2 g/dL — ABNORMAL LOW (ref 6.5–8.1)

## 2018-11-21 LAB — RPR: RPR Ser Ql: NONREACTIVE

## 2018-11-21 LAB — PROTEIN / CREATININE RATIO, URINE
Creatinine, Urine: 42.47 mg/dL
Protein Creatinine Ratio: 0.35 mg/mg{Cre} — ABNORMAL HIGH (ref 0.00–0.15)
Total Protein, Urine: 15 mg/dL

## 2018-11-21 LAB — SARS CORONAVIRUS 2 BY RT PCR (HOSPITAL ORDER, PERFORMED IN ~~LOC~~ HOSPITAL LAB): SARS Coronavirus 2: NEGATIVE

## 2018-11-21 LAB — TYPE AND SCREEN
ABO/RH(D): B POS
Antibody Screen: NEGATIVE

## 2018-11-21 LAB — ABO/RH: ABO/RH(D): B POS

## 2018-11-21 MED ORDER — COCONUT OIL OIL: 1.0000 "application " | TOPICAL_OIL | Status: DC | PRN

## 2018-11-21 MED ORDER — SOD CITRATE-CITRIC ACID 500-334 MG/5ML PO SOLN: 30.0000 mL | ORAL | Status: DC | PRN

## 2018-11-21 MED ORDER — FENTANYL-BUPIVACAINE-NACL 0.5-0.125-0.9 MG/250ML-% EP SOLN
12.0000 mL/h | EPIDURAL | Status: DC | PRN
  Filled 2018-11-21: qty 250

## 2018-11-21 MED ORDER — LIDOCAINE HCL (PF) 1 % IJ SOLN: 30.0000 mL | INTRAMUSCULAR | Status: DC | PRN

## 2018-11-21 MED ORDER — OXYTOCIN BOLUS FROM INFUSION
500.0000 mL | Freq: Once | INTRAVENOUS | Status: AC
  Administered 2018-11-21: 500 mL via INTRAVENOUS

## 2018-11-21 MED ORDER — ONDANSETRON HCL 4 MG PO TABS: 4.0000 mg | ORAL_TABLET | ORAL | Status: DC | PRN

## 2018-11-21 MED ORDER — DOCUSATE SODIUM 100 MG PO CAPS
100.0000 mg | ORAL_CAPSULE | Freq: Two times a day (BID) | ORAL | Status: DC
  Administered 2018-11-21 – 2018-11-22 (×3): 100 mg via ORAL
  Filled 2018-11-21 (×4): qty 1

## 2018-11-21 MED ORDER — BENZOCAINE-MENTHOL 20-0.5 % EX AERO: 1.0000 "application " | INHALATION_SPRAY | CUTANEOUS | Status: DC | PRN

## 2018-11-21 MED ORDER — ACETAMINOPHEN 325 MG PO TABS: 650.0000 mg | ORAL_TABLET | ORAL | Status: DC | PRN

## 2018-11-21 MED ORDER — OXYTOCIN 40 UNITS IN NORMAL SALINE INFUSION - SIMPLE MED: 2.5000 [IU]/h | INTRAVENOUS | Status: DC

## 2018-11-21 MED ORDER — ZOLPIDEM TARTRATE 5 MG PO TABS: 5.0000 mg | ORAL_TABLET | Freq: Every evening | ORAL | Status: DC | PRN

## 2018-11-21 MED ORDER — PHENYLEPHRINE 40 MCG/ML (10ML) SYRINGE FOR IV PUSH (FOR BLOOD PRESSURE SUPPORT): 80.0000 ug | PREFILLED_SYRINGE | INTRAVENOUS | Status: DC | PRN

## 2018-11-21 MED ORDER — DIPHENHYDRAMINE HCL 25 MG PO CAPS: 25.0000 mg | ORAL_CAPSULE | Freq: Four times a day (QID) | ORAL | Status: DC | PRN

## 2018-11-21 MED ORDER — EPHEDRINE 5 MG/ML INJ: 10.0000 mg | INTRAVENOUS | Status: DC | PRN

## 2018-11-21 MED ORDER — SODIUM CHLORIDE (PF) 0.9 % IJ SOLN
INTRAMUSCULAR | Status: DC | PRN
  Administered 2018-11-21: 12 mL/h via EPIDURAL

## 2018-11-21 MED ORDER — MISOPROSTOL 50MCG HALF TABLET
50.0000 ug | ORAL_TABLET | ORAL | Status: DC | PRN
  Administered 2018-11-21: 50 ug via BUCCAL
  Filled 2018-11-21: qty 1

## 2018-11-21 MED ORDER — SIMETHICONE 80 MG PO CHEW: 80.0000 mg | CHEWABLE_TABLET | ORAL | Status: DC | PRN

## 2018-11-21 MED ORDER — OXYCODONE-ACETAMINOPHEN 5-325 MG PO TABS: 2.0000 | ORAL_TABLET | ORAL | Status: DC | PRN

## 2018-11-21 MED ORDER — FENTANYL CITRATE (PF) 100 MCG/2ML IJ SOLN
100.0000 ug | INTRAMUSCULAR | Status: DC | PRN
  Administered 2018-11-21: 100 ug via INTRAVENOUS
  Filled 2018-11-21: qty 2

## 2018-11-21 MED ORDER — LACTATED RINGERS IV SOLN
500.0000 mL | Freq: Once | INTRAVENOUS | Status: AC
  Administered 2018-11-21: 500 mL via INTRAVENOUS

## 2018-11-21 MED ORDER — WITCH HAZEL-GLYCERIN EX PADS: 1.0000 "application " | MEDICATED_PAD | CUTANEOUS | Status: DC | PRN

## 2018-11-21 MED ORDER — PRENATAL MULTIVITAMIN CH
1.0000 | ORAL_TABLET | Freq: Every day | ORAL | Status: DC
  Administered 2018-11-22: 1 via ORAL
  Filled 2018-11-21: qty 1

## 2018-11-21 MED ORDER — ONDANSETRON HCL 4 MG/2ML IJ SOLN: 4.0000 mg | Freq: Four times a day (QID) | INTRAMUSCULAR | Status: DC | PRN

## 2018-11-21 MED ORDER — MEASLES, MUMPS & RUBELLA VAC IJ SOLR: 0.5000 mL | Freq: Once | INTRAMUSCULAR | Status: DC

## 2018-11-21 MED ORDER — OXYTOCIN 40 UNITS IN NORMAL SALINE INFUSION - SIMPLE MED
1.0000 m[IU]/min | INTRAVENOUS | Status: DC
  Administered 2018-11-21: 2 m[IU]/min via INTRAVENOUS
  Filled 2018-11-21: qty 1000

## 2018-11-21 MED ORDER — METHYLDOPA 250 MG PO TABS
500.0000 mg | ORAL_TABLET | Freq: Two times a day (BID) | ORAL | Status: DC
  Administered 2018-11-21: 500 mg via ORAL
  Filled 2018-11-21 (×2): qty 2

## 2018-11-21 MED ORDER — DIBUCAINE (PERIANAL) 1 % EX OINT: 1.0000 "application " | TOPICAL_OINTMENT | CUTANEOUS | Status: DC | PRN

## 2018-11-21 MED ORDER — ONDANSETRON HCL 4 MG/2ML IJ SOLN: 4.0000 mg | INTRAMUSCULAR | Status: DC | PRN

## 2018-11-21 MED ORDER — LACTATED RINGERS IV SOLN: 500.0000 mL | INTRAVENOUS | Status: DC | PRN

## 2018-11-21 MED ORDER — FENTANYL CITRATE (PF) 100 MCG/2ML IJ SOLN: 50.0000 ug | INTRAMUSCULAR | Status: DC | PRN

## 2018-11-21 MED ORDER — TETANUS-DIPHTH-ACELL PERTUSSIS 5-2.5-18.5 LF-MCG/0.5 IM SUSP: 0.5000 mL | Freq: Once | INTRAMUSCULAR | Status: DC

## 2018-11-21 MED ORDER — LACTATED RINGERS IV SOLN
INTRAVENOUS | Status: DC
  Administered 2018-11-21: 07:00:00 via INTRAVENOUS

## 2018-11-21 MED ORDER — OXYCODONE-ACETAMINOPHEN 5-325 MG PO TABS: 1.0000 | ORAL_TABLET | ORAL | Status: DC | PRN

## 2018-11-21 MED ORDER — TERBUTALINE SULFATE 1 MG/ML IJ SOLN: 0.2500 mg | Freq: Once | INTRAMUSCULAR | Status: DC | PRN

## 2018-11-21 MED ORDER — IBUPROFEN 800 MG PO TABS
800.0000 mg | ORAL_TABLET | Freq: Three times a day (TID) | ORAL | Status: DC
  Administered 2018-11-21 – 2018-11-23 (×6): 800 mg via ORAL
  Filled 2018-11-21 (×6): qty 1

## 2018-11-21 MED ORDER — LIDOCAINE HCL (PF) 1 % IJ SOLN
INTRAMUSCULAR | Status: DC | PRN
  Administered 2018-11-21 (×2): 5 mL via EPIDURAL

## 2018-11-21 MED ORDER — DIPHENHYDRAMINE HCL 50 MG/ML IJ SOLN: 12.5000 mg | INTRAMUSCULAR | Status: DC | PRN

## 2018-11-21 NOTE — MAU Note (Signed)
Pt reports contractions every 15-20 mins. Denies LOF or vaginal bleeding. Reports good fetal movement. Reports she is scheduled for IOL on Saturday.

## 2018-11-21 NOTE — Progress Notes (Signed)
OB/GYN Faculty Practice: Labor Progress Note  Subjective: Doing well, feeling very uncomfortable during contractions. Induction started this morning. Denies headaches, vision changes, shortness of breath.   Objective: BP 138/82   Pulse 81   Temp 98.8 F (37.1 C) (Oral)   Resp 20   Ht 5\' 8"  (1.727 m)   Wt (!) 140.2 kg   LMP 02/22/2018   SpO2 100%   BMI 46.98 kg/m  Gen: uncomfortable appearing  Dilation: 1 Effacement (%): 50 Station: Ballotable Presentation: (foley bulb in place) Exam by:: m wilkins rnc  Assessment and Plan: 33 y.o. G2P1001 [redacted]w[redacted]d here for IOL for superimposed preeclampsia.   Labor: Induction started with FB, cytotec. Starting to have contractions every 5-8 minutes and quite uncomfortable. -- pain control: offered fentanyl, desires epidural eventually -- PPH Risk: medium (BMI, HTN)  Fetal Well-Being: EFW 8lbs by Leopolds EFW 37% at 35w2. Cephalic by sutures on prior checks by CNM.  -- Category I - continuous fetal monitoring  -- GBS negative  Superimposed Preeclampsia: Diagnosed today with UPC 0.35, other labs wnl. Asymptomatic. Normal to moderate range pressures here. On methyldopa outpatient.  -- continue to monitor closely   Maureen Kendrix S. Juleen China, DO OB/GYN Fellow, Faculty Practice  9:23 AM

## 2018-11-21 NOTE — H&P (Addendum)
Maureen Griffin is a 33 y.o. female, G2P1001 at 38.6 weeks, presenting for labor evaluation, but found to have elevated bp.  Patient receives care at [facility] and was supervised for a High risk pregnancy. Pregnancy and medical history significant for problems as listed below. She is GBS Negative.  She is anticipating a female infant and requests Nexplanon for PP birth control method.    Patient known CHTN and currently taking Procardia XL 30mg  daily.  Patient denies PreE symptoms and vaginal concerns, while endorsing fetal movement and contractions q10-53min.    Patient Active Problem List   Diagnosis Date Noted  . Abnormal glucose tolerance test (GTT) during pregnancy, antepartum 09/23/2018  . Chronic hypertension during pregnancy, antepartum 09/19/2018  . Gestational diabetes mellitus (GDM) affecting pregnancy, antepartum 09/19/2018  . GERD (gastroesophageal reflux disease) 08/04/2018  . Short interval between pregnancies affecting pregnancy, antepartum 05/12/2018  . Maternal morbid obesity, antepartum ([place]) 05/12/2018  . Elevated AFP 12/29/2016  . Supervision of high risk pregnancy, antepartum 11/04/2016    History of present pregnancy:  Last evaluation:  11/17/2018 in office with Dr. Gardiner Fanti   Nursing Staff Provider  Office Location  [place] Dating  LMP c/w 19 w sono  Language  English Anatomy US  normal  Flu Vaccine  04/15/18 Genetic Screen  NIPS: low risks  AFP:  elevated   TDaP vaccine  Deferred, Info given 09/19/18 Hgb A1C or  GTT Early  Third trimester abnormal 2 hour  Rhogam   N/A   LAB RESULTS   Feeding Plan Breast  Blood Type B/Positive/-- (11/07 1615)   Contraception Nexplanon  Antibody Negative (11/07 1615)  Circumcision Yes  Rubella 12.00 (11/07 1615)  Pediatrician  Dr. Emmaline Kluver [facility] Non Reactive (02/28 0944)   Support Person Maureen Griffin  HBsAg Negative (11/07 1615)   Prenatal Classes No  HIV Non Reactive (02/28 0944)  BTL Consent  GBS  (For PCN allergy,  check sensitivities)   VBAC Consent  Pap  Normal 09/2016    Hgb Electro  C-trait heterozygous (benign)    CF Neg     SMA  3 copies    Waterbirth  [ ]  Class [ ]  Consent [ ]  CNM visit     OB History    Gravida  2   Para  1   Term  1   Preterm      AB      Living  1     SAB      TAB      Ectopic      Multiple  0   Live Births  1             Past Medical History:  Diagnosis Date  . Fibroid   . Gestational diabetes   . Hypertension   . Morbid obesity ([place]) 04/24/2016  . Pregnancy induced hypertension    Past Surgical History:  Procedure Laterality Date  . NO PAST SURGERIES     Family History: family history includes Healthy in her mother. Social History:  reports that she has never smoked. She has never used smokeless tobacco. She reports that she does not drink alcohol or use drugs.   Prenatal Transfer Tool  Maternal Diabetes: No Genetic Screening: Normal Maternal Ultrasounds/Referrals: Normal Fetal Ultrasounds or other Referrals:  None Maternal Substance Abuse:  No Significant Maternal Medications:  Meds include: Other: Procardia XL Significant Maternal Lab Results: Lab values include: Group B Strep negative   Maternal Assessment:  ROS: +Contractions, -LOF, -Vaginal Bleeding, +Fetal Movement  All other systems reviewed and negative.  Results for orders placed or performed during the hospital encounter of 11/21/18 (from the past 24 hour(s))  CBC     Status: None   Collection Time: 11/21/18  3:39 AM  Result Value Ref Range   WBC 9.9 4.0 - 10.5 K/uL   RBC 4.57 3.87 - 5.11 MIL/uL   Hemoglobin 13.5 12.0 - 15.0 g/dL   HCT 37.9 36.0 - 46.0 %   MCV 82.9 80.0 - 100.0 fL   MCH 29.5 26.0 - 34.0 pg   MCHC 35.6 30.0 - 36.0 g/dL   RDW 13.9 11.5 - 15.5 %   Platelets 229 150 - 400 K/uL   nRBC 0.0 0.0 - 0.2 %  Comprehensive metabolic panel     Status: Abnormal   Collection Time: 11/21/18  3:39 AM  Result Value Ref Range   Sodium 135 135 - 145 mmol/L    Potassium 3.9 3.5 - 5.1 mmol/L   Chloride 104 98 - 111 mmol/L   CO2 21 (L) 22 - 32 mmol/L   Glucose, Bld 98 70 - 99 mg/dL   BUN 6 6 - 20 mg/dL   Creatinine, Ser 0.52 0.44 - 1.00 mg/dL   Calcium 9.0 8.9 - 10.3 mg/dL   Total Protein 6.2 (L) 6.5 - 8.1 g/dL   Albumin 2.8 (L) 3.5 - 5.0 g/dL   AST 23 15 - 41 U/L   ALT 23 0 - 44 U/L   Alkaline Phosphatase 262 (H) 38 - 126 U/L   Total Bilirubin 0.6 0.3 - 1.2 mg/dL   GFR calc non Af Amer >60 >60 mL/min   GFR calc Af Amer >60 >60 mL/min   Anion gap 10 5 - 15  Protein / creatinine ratio, urine     Status: Abnormal   Collection Time: 11/21/18  3:42 AM  Result Value Ref Range   Creatinine, Urine 42.47 mg/dL   Total Protein, Urine 15 mg/dL   Protein Creatinine Ratio 0.35 (H) 0.00 - 0.15 mg/mg[Cre]     No Known Allergies   Dilation: Closed(external os 1) Effacement (%): 50 Station: Ballotable Exam by:: Maryagnes Amos RN Blood pressure (!) 145/84, pulse 82, temperature 98.8 F (37.1 C), temperature source Oral, resp. rate (!) 21, height 5\' 8"  (1.727 m), weight (!) 140.2 kg, last menstrual period 02/22/2018, SpO2 100 %, unknown if currently breastfeeding.  Physical Exam  Constitutional: She is oriented to person, place, and time. She appears well-developed and well-nourished. She appears distressed.  HENT:  Head: Normocephalic and atraumatic.  Eyes: Conjunctivae are normal.  Neck: Normal range of motion.  Cardiovascular: Normal rate, regular rhythm and normal heart sounds.  Respiratory: Effort normal and breath sounds normal.  GI: Soft.  BSUS confirms Cephalic Large Panus Soft RT  Genitourinary:    Vagina normal.  Cervix exhibits discharge. Cervix exhibits no motion tenderness and no friability.    No vaginal discharge or bleeding.  No bleeding in the vagina.  Musculoskeletal: Normal range of motion.  Neurological: She is alert and oriented to person, place, and time.  Skin: Skin is warm and dry.  Psychiatric: She has a  normal mood and affect. Her behavior is normal.    Fetal Assessment: Leopolds: -Pelvis:Proven to 6lbs 2oz -EFW: Indeterminable d/t body habitus -Presentation:Cephalic by BSUS  FHR: 235 bpm, Mod Var, -Decels, +10x10 Accels UCs:  None graphed    Assessment SIUP at 38.6wks CHTN with SI PreEclampsia Cat I FT  Plan: Admit to [facility]  Routine Labor and Delivery Orders per Protocol Routine IOL orders previously placed Procardia XL 30mg  daily In room to complete assessment and discuss POC: Educated on PreEclampsia Discussed IOL No other questions or concerns V. Stann Mainland to assume care  Loann Quill, MSN 11/21/2018, 4:51 AM  Follow Up (5:48 AM)  -V. Rogers request insertion of foley bulb. -In room to discuss. -Discussed foley bulb insertion including r/b, placement, associated discomfort, initiation of pitocin, and removal. -No questions or concerns. -61fr catheter placed in cervix, without difficulty, via speculum exam. -Instilled with 58mL of sterile saline -Patient tolerated well. -Will continue to monitor until bed ready on L&D.  Maryann Conners MSN, CNM 6:05 AM

## 2018-11-21 NOTE — Progress Notes (Signed)
OB/GYN Faculty Practice: Labor Progress Note  Subjective: Doing well, comfortable with epidural in place.   Objective: BP 134/73   Pulse 67   Temp 98.8 F (37.1 C) (Oral)   Resp 20   Ht 5\' 8"  (1.727 m)   Wt (!) 140.2 kg   LMP 02/22/2018   SpO2 100%   BMI 46.98 kg/m  Gen: well-appearing, NAD Dilation: 7 Effacement (%): 70 Station: -1 Presentation: Vertex Exam by:: Xyon Lukasik  Assessment and Plan: 33 y.o. G2P1001 [redacted]w[redacted]d here for IOL for superimposed preeclampsia.   Labor: FB came out, progressed well with one dose of cytotec. Contractions still far apart so will start low-dose pitocin. Consider AROM with next check prn, BBOW now present  -- pain control: epidural  -- PPH Risk: medium (BMI, HTN)  Fetal Well-Being: EFW 8lbs by Leopolds EFW 37% at 35w2. Cephalic by sutures on prior checks by CNM.  -- Category I - continuous fetal monitoring  -- GBS negative  Superimposed Preeclampsia: Diagnosed today with UPC 0.35, other labs wnl. Asymptomatic. Normal to moderate range pressures here. On methyldopa outpatient.  -- continue to monitor closely   Maureen Griffin S. Juleen China, DO OB/GYN Fellow, Faculty Practice  12:54 PM

## 2018-11-21 NOTE — Discharge Summary (Signed)
Obstetrics Discharge Summary OB/GYN Faculty Practice   Patient Name: Maureen Griffin DOB: 06/11/1986 MRN: [id number]  Date of admission: 11/21/2018 Delivering MD: WALLACE, LAUREL S   Date of discharge: 11/23/2018  Admitting diagnosis: 38.5wks, contractions Intrauterine pregnancy: [redacted]w[redacted]d     Secondary diagnosis:   Principal Problem:   Chronic hypertension during pregnancy, antepartum Active Problems:   Elevated AFP   Short interval between pregnancies affecting pregnancy, antepartum   Maternal morbid obesity, antepartum ([facility])   GERD (gastroesophageal reflux disease)   Gestational diabetes mellitus (GDM) affecting pregnancy, antepartum   Abnormal glucose tolerance test (GTT) during pregnancy, antepartum    Discharge diagnosis: Term Pregnancy Delivered                                Postpartum procedures: None Complications: none  Outpatient Follow-Up: [ ] BP check  [ ] 2-hr GTT [ ]  Copper T IUD placement at postpartum visit - counsel on alternative options Re: BMI   Physical exam  Vitals:   11/22/18 0800 11/22/18 1410 11/22/18 2047 11/23/18 0533  BP: 131/73 139/89 136/72 139/77  Pulse: 68 71 76 71  Resp: 20 20 18 18  Temp: 98.7 F (37.1 C) 99.1 F (37.3 C) 98.5 F (36.9 C) 98.3 F (36.8 C)  TempSrc: Oral Oral Oral Oral  SpO2:    99%  Weight:      Height:       General: alert Lochia: appropriate Uterine Fundus: firm Incision: N/A DVT Evaluation: No evidence of DVT seen on physical exam.  Labs: Lab Results  Component Value Date   WBC 12.8 (H) 11/21/2018   HGB 14.1 11/21/2018   HCT 38.9 11/21/2018   MCV 83.3 11/21/2018   PLT 251 11/21/2018   CMP Latest Ref Rng & Units 11/21/2018  Glucose 70 - 99 mg/dL 98  BUN 6 - 20 mg/dL 6  Creatinine 0.44 - 1.00 mg/dL 0.52  Sodium 135 - 145 mmol/L 135  Potassium 3.5 - 5.1 mmol/L 3.9  Chloride 98 - 111 mmol/L 104  CO2 22 - 32 mmol/L 21(L)  Calcium 8.9 - 10.3 mg/dL 9.0  Total Protein 6.5 - 8.1 g/dL 6.2(L)  Total  Bilirubin 0.3 - 1.2 mg/dL 0.6  Alkaline Phos 38 - 126 U/L 262(H)  AST 15 - 41 U/L 23  ALT 0 - 44 U/L 23    Discharge instructions: Per After Visit Summary and "Baby and Me Booklet"  After visit meds:  Allergies as of 11/23/2018   No Known Allergies     Medication List    STOP taking these medications   Accu-Chek FastClix Lancets Misc   aspirin EC 81 MG tablet   B-6 PO   Blood Glucose Monitoring Suppl w/Device Kit   glucose blood test strip   glycopyrrolate 2 MG tablet Commonly known as:  ROBINUL   metFORMIN 500 MG tablet Commonly known as:  GLUCOPHAGE   methyldopa 500 MG tablet Commonly known as:  ALDOMET   NIFEdipine 30 MG 24 hr tablet Commonly known as:  PROCARDIA-XL/NIFEDICAL-XL   UNISOM PO     TAKE these medications   acetaminophen 325 MG tablet Commonly known as:  Tylenol Take 2 tablets (650 mg total) by mouth every 4 (four) hours as needed (for pain scale < 4).   ibuprofen 800 MG tablet Commonly known as:  ADVIL Take 1 tablet (800 mg total) by mouth 3 (three) times daily.   prenatal multivitamin Tabs   tablet Take 1 tablet by mouth daily at 12 noon.       Postpartum contraception: IUD Paragard Diet: Routine Diet Activity: Advance as tolerated. Pelvic rest for 6 weeks.   Follow-up Appt: Future Appointments  Date Time Provider Department Center  01/02/2019  8:15 AM [facility] None  01/02/2019  8:30 AM Arnold, James G, MD [facility] None  06/12/2019  2:45 PM Wendling, Nicholas Paul, DO [facility]   Follow-up Visit:No follow-ups on file.  Please schedule this patient for Postpartum visit in: 4 weeks with the following provider: Any provider High risk pregnancy complicated by: HTN, GDM Delivery mode:  SVD Anticipated Birth Control:  Nexplanon PP Procedures needed: BP check, 2-hr GTT  Schedule Integrated BH visit: no  Newborn Data: Live born female  APGAR: 8, 9  Newborn Delivery   Birth date/time:  11/21/2018 13:38:00 Delivery type:   Vaginal, Spontaneous     Baby Feeding: Breast Disposition:home with mother        

## 2018-11-21 NOTE — Anesthesia Preprocedure Evaluation (Addendum)
Anesthesia Evaluation  Patient identified by MRN, date of birth, ID band Patient awake    Reviewed: Allergy & Precautions, NPO status , Patient's Chart, lab work & pertinent test results  History of Anesthesia Complications Negative for: history of anesthetic complications  Airway Mallampati: III  TM Distance: >3 FB Neck ROM: Full    Dental  (+) Dental Advisory Given   Pulmonary neg pulmonary ROS,    breath sounds clear to auscultation       Cardiovascular hypertension, Pt. on medications (-) angina Rhythm:Regular Rate:Normal     Neuro/Psych negative neurological ROS     GI/Hepatic Neg liver ROS, GERD  ,  Endo/Other  diabetes (glu 98)Morbid obesity  Renal/GU negative Renal ROS     Musculoskeletal   Abdominal (+) + obese,   Peds  Hematology plt 236k   Anesthesia Other Findings   Reproductive/Obstetrics (+) Pregnancy                             Anesthesia Physical Anesthesia Plan  ASA: III  Anesthesia Plan: Epidural   Post-op Pain Management:    Induction:   PONV Risk Score and Plan: 2 and Treatment may vary due to age or medical condition  Airway Management Planned: Natural Airway  Additional Equipment:   Intra-op Plan:   Post-operative Plan:   Informed Consent: I have reviewed the patients History and Physical, chart, labs and discussed the procedure including the risks, benefits and alternatives for the proposed anesthesia with the patient or authorized representative who has indicated his/her understanding and acceptance.     Dental advisory given  Plan Discussed with:   Anesthesia Plan Comments: (Patient identified. Risks/Benefits/Options discussed with patient including but not limited to bleeding, infection, nerve damage, paralysis, failed block, incomplete pain control, headache, blood pressure changes, nausea, vomiting, reactions to medication both or allergic,  itching and postpartum back pain. Confirmed with bedside nurse the patient's most recent platelet count. Confirmed with patient that they are not currently taking any anticoagulation, have any bleeding history or any family history of bleeding disorders. Patient expressed understanding and wished to proceed. All questions were answered. )        Anesthesia Quick Evaluation

## 2018-11-21 NOTE — Anesthesia Procedure Notes (Signed)
Epidural Patient location during procedure: OB Start time: 11/21/2018 9:42 AM End time: 11/21/2018 10:00 AM  Staffing Anesthesiologist: Annye Asa, MD Performed: anesthesiologist   Preanesthetic Checklist Completed: patient identified, surgical consent, pre-op evaluation, timeout performed, IV checked, risks and benefits discussed and monitors and equipment checked  Epidural Patient position: sitting Prep: site prepped and draped and DuraPrep Patient monitoring: blood pressure, continuous pulse ox and heart rate Approach: midline Location: L2-L3 Injection technique: LOR air  Needle:  Needle type: Tuohy  Needle gauge: 17 G Needle length: 9 cm Needle insertion depth: 9 cm Catheter type: closed end flexible Catheter size: 19 Gauge Catheter at skin depth: 15 cm Test dose: negative (1% LIDOCAINE)  Assessment Events: blood not aspirated, injection not painful, no injection resistance, negative IV test and no paresthesia  Additional Notes Pt identified in Labor room.  Monitors applied. Working IV access confirmed. Sterile prep, drape lumbar spine.  1% lido local L 2,3.  #17ga Touhy LOR air at 9 cm L 2,3, cath in easily to 15 cm skin. Test dose OK, cath dosed and infusion begun.  Patient asymptomatic, VSS, no heme aspirated, tolerated well.  Jenita Seashore, MDReason for block:procedure for pain

## 2018-11-21 NOTE — MAU Provider Note (Signed)
History     CSN: [id number]  Arrival date and time: 11/21/18 0234   First Provider Initiated Contact with Patient 11/21/18 0319      Chief Complaint  Patient presents with  . Labor Eval   Maureen Griffin is a 33 y.o. G2P1001 at 5w6dwho presents for Labor Eval.  However, after evaluation nurse reports patient with elevated bp.  IN room to assess. Patient reports compliance with hypertensive medication-Aldomet '500mg'$  BID and last dose was at 7pm.  However, she states she has not taken her blood pressure all weekend.  Patient denies HA, visual disturbances, SOB, and epigastric pain.  She reports fetal movement and contractions, but denies vaginal concerns.      OB History    Gravida  2   Para  1   Term  1   Preterm      AB      Living  1     SAB      TAB      Ectopic      Multiple  0   Live Births  1           Past Medical History:  Diagnosis Date  . Fibroid   . Gestational diabetes   . Hypertension   . [place]  . Pregnancy induced hypertension     Past Surgical History:  Procedure Laterality Date  . NO PAST SURGERIES      Family History  Problem Relation Age of Onset  . Healthy Mother   . Cancer Neg Hx   . Diabetes Neg Hx   . Hypertension Neg Hx   . Stroke Neg Hx     Social History   Tobacco Use  . Smoking status: Never Smoker  . Smokeless tobacco: Never Used  Substance Use Topics  . Alcohol use: No  . Drug use: No    Allergies: No Known Allergies  Medications Prior to Admission  Medication Sig Dispense Refill Last Dose  . aspirin EC 81 MG tablet Take 1 tablet (81 mg total) by mouth daily. Take after 12 weeks for prevention of preeclampsia later in pregnancy 300 tablet 2 11/20/2018 at Unknown time  . metFORMIN (GLUCOPHAGE) 500 MG tablet Take 1 tablet (500 mg total) by mouth 2 (two) times daily with a meal. 60 tablet 5 11/20/2018 at Unknown time  . methyldopa (ALDOMET) 500 MG tablet Take 500 mg by mouth 2 (two)  times daily.   11/20/2018 at Unknown time  . Prenatal Vit-Fe Fumarate-FA (PRENATAL MULTIVITAMIN) TABS tablet Take 1 tablet by mouth daily at 12 noon.   11/20/2018 at Unknown time  . Pyridoxine HCl (B-6 PO) Take by mouth.   11/20/2018 at Unknown time  . ACCU-CHEK FASTCLIX LANCETS MISC 1 Units by Percutaneous route 4 (four) times daily. 100 each 12 Taking  . Blood Glucose Monitoring Suppl w/Device KIT Use as directed 4 times a day 1 each 0 Taking  . calcium carbonate (TUMS - DOSED IN MG ELEMENTAL CALCIUM) 500 MG chewable tablet Chew 1 tablet by mouth daily as needed for indigestion or heartburn.   Taking  . Doxylamine Succinate, Sleep, (UNISOM PO) Take by mouth.   Taking  . glucose blood test strip Use as instructed 100 each 12 Taking  . glycopyrrolate (ROBINUL) 2 MG tablet Take 1 tablet (2 mg total) by mouth 3 (three) times daily as needed. (Patient not taking: Reported on 11/17/2018) 30 tablet 3 Not Taking  . NIFEdipine (PROCARDIA-XL/NIFEDICAL-XL) 30 MG 24  hr tablet Take 1 tablet (30 mg total) by mouth daily. (Patient not taking: Reported on 11/17/2018) 30 tablet 2 Not Taking    Review of Systems  Constitutional: Negative for chills and fever.  Eyes: Negative for visual disturbance.  Respiratory: Negative for cough and shortness of breath.   Gastrointestinal: Negative for constipation, diarrhea, nausea and vomiting.  Genitourinary: Negative for dysuria, vaginal bleeding and vaginal discharge.  Neurological: Negative for dizziness, light-headedness and headaches.   Physical Exam   Blood pressure (!) 152/80, pulse 82, temperature 98.8 F (37.1 C), temperature source Oral, resp. rate (!) 21, height '5\' 8"'$  (1.727 m), weight (!) 140.2 kg, last menstrual period 02/22/2018, SpO2 100 %, unknown if currently breastfeeding. Vitals:   11/21/18 0416 11/21/18 0431  BP: (!) 139/92 (!) 145/84  Pulse: 89 82  Resp:    Temp:    SpO2:      Physical Exam  Constitutional: She is oriented to person, place, and  time. She appears distressed (with contractions).  Obese  HENT:  Head: Normocephalic and atraumatic.  Eyes: Conjunctivae are normal.  Neck: Normal range of motion.  Cardiovascular: Normal rate.  Respiratory: Effort normal.  GI: Soft.  BSUS confirms Cephalic Presentation  Musculoskeletal: Normal range of motion.  Neurological: She is alert and oriented to person, place, and time.  Skin: Skin is warm and dry.  Psychiatric: She has a normal mood and affect. Her behavior is normal.    Fetal Assessment 135 bpm, Mod Var, -Decels, +10x10Accels Toco: Irritability Graphed  MAU Course   Results for orders placed or performed during the hospital encounter of 11/21/18 (from the past 24 hour(s))  CBC     Status: None   Collection Time: 11/21/18  3:39 AM  Result Value Ref Range   WBC 9.9 4.0 - 10.5 K/uL   RBC 4.57 3.87 - 5.11 MIL/uL   Hemoglobin 13.5 12.0 - 15.0 g/dL   HCT 37.9 36.0 - 46.0 %   MCV 82.9 80.0 - 100.0 fL   MCH 29.5 26.0 - 34.0 pg   MCHC 35.6 30.0 - 36.0 g/dL   RDW 13.9 11.5 - 15.5 %   Platelets 229 150 - 400 K/uL   nRBC 0.0 0.0 - 0.2 %  Comprehensive metabolic panel     Status: Abnormal   Collection Time: 11/21/18  3:39 AM  Result Value Ref Range   Sodium 135 135 - 145 mmol/L   Potassium 3.9 3.5 - 5.1 mmol/L   Chloride 104 98 - 111 mmol/L   CO2 21 (L) 22 - 32 mmol/L   Glucose, Bld 98 70 - 99 mg/dL   BUN 6 6 - 20 mg/dL   Creatinine, Ser 0.52 0.44 - 1.00 mg/dL   Calcium 9.0 8.9 - 10.3 mg/dL   Total Protein 6.2 (L) 6.5 - 8.1 g/dL   Albumin 2.8 (L) 3.5 - 5.0 g/dL   AST 23 15 - 41 U/L   ALT 23 0 - 44 U/L   Alkaline Phosphatase 262 (H) 38 - 126 U/L   Total Bilirubin 0.6 0.3 - 1.2 mg/dL   GFR calc non Af Amer >60 >60 mL/min   GFR calc Af Amer >60 >60 mL/min   Anion gap 10 5 - 15  Protein / creatinine ratio, urine     Status: Abnormal   Collection Time: 11/21/18  3:42 AM  Result Value Ref Range   Creatinine, Urine 42.47 mg/dL   Total Protein, Urine 15 mg/dL    Protein Creatinine Ratio 0.35 (H)  0.00 - 0.15 mg/mg[Cre]   No results found.  MDM PE Labs:PreE labs EFM  Assessment and Plan  33 year old G2P1001  SIUP at 75.6weeks Cat I FT CHTN on meds   -Exam findings discussed. -Informed that PreE labs would be collected. -No questions or concerns. -Will await results.  Follow Up (4:44 AM) PreEclampsia  -Labs return significant for PreE -Patient informed that in setting of GA and NR FHT, would admit for induction. -Patient questions regarding PreEclampsia addressed. -V. Stann Mainland informed of patient status and agrees.  Maryann Conners MSN, CNM 11/21/2018, 3:19 AM

## 2018-11-21 NOTE — Lactation Note (Signed)
This note was copied from a baby's chart. Lactation Consultation Note  Patient Name: Maureen Griffin [id number] Date: 11/21/2018 Reason for consult: Initial assessment;Early term 2-38.6wks  P2 mother whose infant is now 54 hours old. This is an ETI at 38+6 weeks.  Mother breast fed her first child (now 42 months) for 11 months.    Baby is currently in the nursery due to decreased temperature.  Mother had no questions/concerns related to breastfeeding.  Encouraged a lot of STS when baby returns and to feed 8-12 times/24 hours or sooner if he shows feeding cues.  Reviewed feeding cues with mother.  She is familiar with hand expression and I encouraged this before/after feedings to help increase milk supply.  Colostrum container provided for any EBM she may obtain with hand expression.  Milk storage times reviewed and finger feeding demonstrated.  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  DEBP provided and paperwork completed and put in folder.  Mother will call for latch assistance as needed.  She will work from home after maternity leave.   Maternal Data Formula Feeding for Exclusion: No Has patient been taught Hand Expression?: Yes Does the patient have breastfeeding experience prior to this delivery?: Yes  Feeding Feeding Type: Formula Nipple Type: Slow - flow  LATCH Score                   Interventions    Lactation Tools Discussed/Used WIC Program: No   Consult Status Consult Status: Follow-up Date: 11/22/18 Follow-up type: In-patient    Little Ishikawa 11/21/2018, 7:49 PM

## 2018-11-22 NOTE — Anesthesia Postprocedure Evaluation (Signed)
Anesthesia Post Note  Patient: Maureen Griffin  Procedure(s) Performed: AN AD HOC LABOR EPIDURAL     Patient location during evaluation: Mother Baby Anesthesia Type: Epidural Level of consciousness: awake, awake and alert and oriented Pain management: pain level controlled Vital Signs Assessment: post-procedure vital signs reviewed and stable Respiratory status: spontaneous breathing and respiratory function stable Cardiovascular status: blood pressure returned to baseline Postop Assessment: no headache, no backache, epidural receding, patient able to bend at knees, no apparent nausea or vomiting, adequate PO intake and able to ambulate Anesthetic complications: no    Last Vitals:  Vitals:   11/22/18 0518 11/22/18 0800  BP: 126/68 131/73  Pulse: 74 68  Resp: 20 20  Temp: 36.7 C 37.1 C  SpO2:      Last Pain:  Vitals:   11/22/18 0800  TempSrc: Oral  PainSc: 0-No pain   Pain Goal:                Epidural/Spinal Function Cutaneous sensation: Normal sensation (11/22/18 0800)  Bufford Spikes

## 2018-11-22 NOTE — Progress Notes (Signed)
Post Partum Day 1 Subjective: no complaints, up ad lib, voiding and tolerating PO  Denies HA, vision changes or epigastric pain.   Objective: Blood pressure 131/73, pulse 68, temperature 98.7 F (37.1 C), temperature source Oral, resp. rate 20, height 5\' 8"  (1.727 m), weight (!) 140.2 kg, last menstrual period 02/22/2018, SpO2 97 %, unknown if currently breastfeeding. Patient Vitals for the past 24 hrs:  BP Temp Temp src Pulse Resp SpO2  11/22/18 0800 131/73 98.7 F (37.1 C) Oral 68 20 -  11/22/18 0518 126/68 98.1 F (36.7 C) Oral 74 20 -    Physical Exam:  General: alert, cooperative, appears stated age and no distress Lochia: appropriate Uterine Fundus: firm Incision: NA DVT Evaluation: No evidence of DVT seen on physical exam.  Recent Labs    11/21/18 0725 11/21/18 1512  HGB 13.5 14.1  HCT 37.3 38.9    Assessment/Plan: Plan for discharge tomorrow, Breastfeeding, Circumcision prior to discharge and Contraception Nexplanon at [facility] visit.   Not on BP med prior to pregnancy. Not indicated currently. Will CTO closely.    LOS: 1 day   [place] 11/22/2018, 1:21 PM

## 2018-11-22 NOTE — Lactation Note (Signed)
This note was copied from a baby's chart. Lactation Consultation Note  Patient Name: Maureen Griffin [id number] Date: 11/22/2018 Reason for consult: Follow-up assessment;Early term 37-38.6wks;Maternal endocrine disorder;Infant weight loss Type of Endocrine Disorder?: Diabetes P2, 31 hour female infant with  weight loss -3%. Per parents, infant had 4 stools and 3 voids since birth. Mom is experienced at breastfeeding she breastfeed her 34 month old for (11 months). Per mom, she feels breastfeeding is going well and infant latches well at breast.  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 LC discussed infant's small tummy size, and Mom to  breastfeed according to infant  hunger cues and on demand , 8-12 times within 24 hours. LC reviewed hand expression and mom taught back and was glad to see colostrum present in left breast, colostrum  Is not present in right breast yet. LC encouraged mom to continue breastfeeding and doing as much STS as possible. Mom's current feeding choice is breast and formula feeding. LC did not observe latch at this time, per mom, she breastfeed infant  : 6 pm ( 30 minutes), 6:45 pm (15 minutes) and 7 pm supplemented with (12 ml of formula). Mom's plan: 1. Breastfeed infant according hunger cues, on demand, 8 -12 times within 24 hours. 2. Then supplement with formula if infant is still cuing to breastfeed  her choice. 3. Parents will continue to do as much STS as possible with infant.  Maternal Data    Feeding Feeding Type: Bottle Fed - Formula Nipple Type: Slow - flow  LATCH Score                   Interventions Interventions: Skin to skin;Hand express;Position options;Breast massage  Lactation Tools Discussed/Used     Consult Status Consult Status: Follow-up Date: 11/23/18 Follow-up type: In-patient    Vicente Serene 11/22/2018, 8:47 PM

## 2018-11-23 ENCOUNTER — Encounter (HOSPITAL_COMMUNITY): Payer: Self-pay | Admitting: *Deleted

## 2018-11-23 ENCOUNTER — Other Ambulatory Visit (HOSPITAL_COMMUNITY): Payer: 59

## 2018-11-23 LAB — GLUCOSE, CAPILLARY: Glucose-Capillary: 97 mg/dL (ref 70–99)

## 2018-11-23 MED ORDER — ACETAMINOPHEN 325 MG PO TABS: 650.0000 mg | ORAL_TABLET | ORAL | 1 refills | Status: DC | PRN

## 2018-11-23 MED ORDER — IBUPROFEN 800 MG PO TABS: 800.0000 mg | ORAL_TABLET | Freq: Three times a day (TID) | ORAL | 0 refills | Status: DC

## 2018-11-23 NOTE — Discharge Instructions (Signed)
Vaginal Delivery, Care After °Refer to this sheet in the next few weeks. These instructions provide you with information about caring for yourself after vaginal delivery. Your health care provider may also give you more specific instructions. Your treatment has been planned according to current medical practices, but problems sometimes occur. Call your health care provider if you have any problems or questions. °What can I expect after the procedure? °After vaginal delivery, it is common to have: °· Some bleeding from your vagina. °· Soreness in your abdomen, your vagina, and the area of skin between your vaginal opening and your anus (perineum). °· Pelvic cramps. °· Fatigue. °Follow these instructions at home: °Medicines °· Take over-the-counter and prescription medicines only as told by your health care provider. °· If you were prescribed an antibiotic medicine, take it as told by your health care provider. Do not stop taking the antibiotic until it is finished. °Driving ° °· Do not drive or operate heavy machinery while taking prescription pain medicine. °· Do not drive for 24 hours if you received a sedative. °Lifestyle °· Do not drink alcohol. This is especially important if you are breastfeeding or taking medicine to relieve pain. °· Do not use tobacco products, including cigarettes, chewing tobacco, or e-cigarettes. If you need help quitting, ask your health care provider. °Eating and drinking °· Drink at least 8 eight-ounce glasses of water every day unless you are told not to by your health care provider. If you choose to breastfeed your baby, you may need to drink more water than this. °· Eat high-fiber foods every day. These foods may help prevent or relieve constipation. High-fiber foods include: °? Whole grain cereals and breads. °? Brown rice. °? Beans. °? Fresh fruits and vegetables. °Activity °· Return to your normal activities as told by your health care provider. Ask your health care provider what  activities are safe for you. °· Rest as much as possible. Try to rest or take a nap when your baby is sleeping. °· Do not lift anything that is heavier than your baby or 10 lb (4.5 kg) until your health care provider says that it is safe. °· Talk with your health care provider about when you can engage in sexual activity. This may depend on your: °? Risk of infection. °? Rate of healing. °? Comfort and desire to engage in sexual activity. °Vaginal Care °· If you have an episiotomy or a vaginal tear, check the area every day for signs of infection. Check for: °? More redness, swelling, or pain. °? More fluid or blood. °? Warmth. °? Pus or a bad smell. °· Do not use tampons or douches until your health care provider says this is safe. °· Watch for any blood clots that may pass from your vagina. These may look like clumps of dark red, brown, or black discharge. °General instructions °· Keep your perineum clean and dry as told by your health care provider. °· Wear loose, comfortable clothing. °· Wipe from front to back when you use the toilet. °· Ask your health care provider if you can shower or take a bath. If you had an episiotomy or a perineal tear during labor and delivery, your health care provider may tell you not to take baths for a certain length of time. °· Wear a bra that supports your breasts and fits you well. °· If possible, have someone help you with household activities and help care for your baby for at least a few days after you   leave the hospital. °· Keep all follow-up visits for you and your baby as told by your health care provider. This is important. °Contact a health care provider if: °· You have: °? Vaginal discharge that has a bad smell. °? Difficulty urinating. °? Pain when urinating. °? A sudden increase or decrease in the frequency of your bowel movements. °? More redness, swelling, or pain around your episiotomy or vaginal tear. °? More fluid or blood coming from your episiotomy or vaginal  tear. °? Pus or a bad smell coming from your episiotomy or vaginal tear. °? A fever. °? A rash. °? Little or no interest in activities you used to enjoy. °? Questions about caring for yourself or your baby. °· Your episiotomy or vaginal tear feels warm to the touch. °· Your episiotomy or vaginal tear is separating or does not appear to be healing. °· Your breasts are painful, hard, or turn red. °· You feel unusually sad or worried. °· You feel nauseous or you vomit. °· You pass large blood clots from your vagina. If you pass a blood clot from your vagina, save it to show to your health care provider. Do not flush blood clots down the toilet without having your health care provider look at them. °· You urinate more than usual. °· You are dizzy or light-headed. °· You have not breastfed at all and you have not had a menstrual period for 12 weeks after delivery. °· You have stopped breastfeeding and you have not had a menstrual period for 12 weeks after you stopped breastfeeding. °Get help right away if: °· You have: °? Pain that does not go away or does not get better with medicine. °? Chest pain. °? Difficulty breathing. °? Blurred vision or spots in your vision. °? Thoughts about hurting yourself or your baby. °· You develop pain in your abdomen or in one of your legs. °· You develop a severe headache. °· You faint. °· You bleed from your vagina so much that you fill two sanitary pads in one hour. °This information is not intended to replace advice given to you by your health care provider. Make sure you discuss any questions you have with your health care provider. °Document Released: 06/19/2000 Document Revised: 12/04/2015 Document Reviewed: 07/07/2015 °Elsevier Interactive Patient Education © 2019 [employer] Inc[employer] ° °

## 2018-11-23 NOTE — Lactation Note (Signed)
This note was copied from a baby's chart. Lactation Consultation Note:  Mother reports that breastfeeding is going well. She chooses to supplement infant with formula.   Mother was given a harmony hand pump and encouraged to use when breast are full and firm to pre-pump. Mother was fit with a #27 flange and then a #30. She reports that she used a larger flange last time. I gave her both 27 and 30. She was unable to describe the difference. I preferred the 27 for her.  Mother has her [employer] employee pump.   She plans to work from home and breastfeed mostly but pump as needed.   Mother denies any nipple pain or breast discomfort .  Advised mother to continue to breastfeed with feeding cues. Feed infant 8-12 times or more in 24 hours. Encouraged frequent STS. Mother has 34 month old at home. We discussed tips on getting child to rest so she can nap . Tips for assistance with having a toddler at home now.   Suggested that mother follow up with [facility] services for OP visit. Mother receptive to idea.  Encouraged mother to do good breast massage and use ice treatment to prevent engorgement.  Mother is aware of available [place] services at [facility].  Patient Name: Maureen Griffin [id number] Date: 11/23/2018 Reason for consult: Follow-up assessment Type of Endocrine Disorder?: Diabetes   Maternal Data    Feeding Feeding Type: Breast Fed Nipple Type: Slow - flow  LATCH Score                   Interventions Interventions: Breast massage;Breast compression;Hand pump;DEBP  Lactation Tools Discussed/Used     Consult Status Consult Status: Complete    Darla Lesches 11/23/2018, 11:14 AM

## 2018-11-24 ENCOUNTER — Encounter: Payer: 59 | Admitting: Obstetrics and Gynecology

## 2018-11-24 ENCOUNTER — Ambulatory Visit (HOSPITAL_COMMUNITY): Payer: 59

## 2018-11-24 ENCOUNTER — Encounter (HOSPITAL_COMMUNITY): Payer: Self-pay

## 2018-11-26 ENCOUNTER — Inpatient Hospital Stay (HOSPITAL_COMMUNITY): Payer: 59

## 2019-01-02 ENCOUNTER — Other Ambulatory Visit: Payer: 59

## 2019-01-02 ENCOUNTER — Encounter: Payer: Self-pay | Admitting: Obstetrics & Gynecology

## 2019-01-02 ENCOUNTER — Other Ambulatory Visit: Payer: Self-pay

## 2019-01-02 ENCOUNTER — Ambulatory Visit (INDEPENDENT_AMBULATORY_CARE_PROVIDER_SITE_OTHER): Payer: 59 | Admitting: Obstetrics & Gynecology

## 2019-01-02 VITALS — BP 125/83 | HR 82 | Ht 68.0 in | Wt 297.4 lb

## 2019-01-02 DIAGNOSIS — O24419 Gestational diabetes mellitus in pregnancy, unspecified control: Secondary | ICD-10-CM

## 2019-01-02 DIAGNOSIS — Z30017 Encounter for initial prescription of implantable subdermal contraceptive: Secondary | ICD-10-CM

## 2019-01-02 MED ORDER — ETONOGESTREL 68 MG ~~LOC~~ IMPL: 68.0000 mg | DRUG_IMPLANT | Freq: Once | SUBCUTANEOUS | Status: DC

## 2019-01-02 NOTE — Progress Notes (Signed)
Subjective:     Maureen Griffin is a 33 y.o. female who presents for a postpartum visit. She is 6 weeks postpartum following a spontaneous vaginal delivery. I have fully reviewed the prenatal and intrapartum course. The delivery was at 38.6 gestational weeks. Outcome: spontaneous vaginal delivery. Anesthesia: epidural. Postpartum course has been Unremarkable. Baby's course has been Unremarkable. Baby is feeding by breast. Bleeding no bleeding. Bowel function is normal. Bladder function is normal. Patient is not sexually active. Contraception method is none. Postpartum depression screening: negative.  The following portions of the patient's history were reviewed and updated as appropriate: allergies, current medications, past family history, past medical history, past social history, past surgical history and problem list.  Review of Systems Pertinent items are noted in HPI.   Objective:    BP 125/83   Pulse 82   Ht 5\' 8"  (1.727 m)   Wt 297 lb 6.4 oz (134.9 kg)   Breastfeeding Yes   BMI 45.22 kg/m   General:  alert, cooperative and no distress                not evaluated  Vagina: not evaluated                    Assessment:     normal postpartum exam. Pap smear not done at today's visit.   Plan:    1. Contraception: Nexplanon 2. Insertion done today after counseling 3. Follow up as needed.    Woodroe Mode, MD 01/02/2019

## 2019-01-02 NOTE — Progress Notes (Signed)
GYNECOLOGY OFFICE PROCEDURE NOTE  Maureen Griffin is a 33 y.o. G2P1001 here for Nexplanon insertion.  Last pap smear was on 2018 and was normal.  No other gynecologic concerns.  Nexplanon Insertion Procedure Patient identified, informed consent performed, consent signed.   Patient does understand that irregular bleeding is a very common side effect of this medication. She was advised to have backup contraception for one week after placement. Pregnancy test in clinic today was negative.  Appropriate time out taken.  Patient's left arm was prepped and draped in the usual sterile fashion. The ruler used to measure and mark insertion area.  Patient was prepped with alcohol swab and then injected with 3 ml of 1% lidocaine.  She was prepped with betadine, Nexplanon removed from packaging,  Device confirmed in needle, then inserted full length of needle and withdrawn per handbook instructions. Nexplanon was able to palpated in the patient's arm; patient palpated the insert herself. There was minimal blood loss.  Patient insertion site covered with gauze and a pressure bandage to reduce any bruising.  The patient tolerated the procedure well and was given post procedure instructions.   Woodroe Mode, MD Attending Obstetrician & Gynecologist, [place] for [place]  01/02/2019

## 2019-01-02 NOTE — Patient Instructions (Signed)
Etonogestrel implant What is this medicine? ETONOGESTREL (et oh noe JES trel) is a contraceptive (birth control) device. It is used to prevent pregnancy. It can be used for up to 3 years. This medicine may be used for other purposes; ask your health care provider or pharmacist if you have questions. COMMON BRAND NAME(S): Implanon, Nexplanon What should I tell my health care provider before I take this medicine? They need to know if you have any of these conditions:  abnormal vaginal bleeding  blood vessel disease or blood clots  breast, cervical, endometrial, ovarian, liver, or uterine cancer  diabetes  gallbladder disease  heart disease or recent heart attack  high blood pressure  high cholesterol or triglycerides  kidney disease  liver disease  migraine headaches  seizures  stroke  tobacco smoker  an unusual or allergic reaction to etonogestrel, anesthetics or antiseptics, other medicines, foods, dyes, or preservatives  pregnant or trying to get pregnant  breast-feeding How should I use this medicine? This device is inserted just under the skin on the inner side of your upper arm by a health care professional. Talk to your pediatrician regarding the use of this medicine in children. Special care may be needed. Overdosage: If you think you have taken too much of this medicine contact a poison control center or emergency room at once. NOTE: This medicine is only for you. Do not share this medicine with others. What if I miss a dose? This does not apply. What may interact with this medicine? Do not take this medicine with any of the following medications:  amprenavir  fosamprenavir This medicine may also interact with the following medications:  acitretin  aprepitant  armodafinil  bexarotene  bosentan  carbamazepine  certain medicines for fungal infections like fluconazole, ketoconazole, itraconazole and voriconazole  certain medicines to treat  hepatitis, HIV or AIDS  cyclosporine  felbamate  griseofulvin  lamotrigine  modafinil  oxcarbazepine  phenobarbital  phenytoin  primidone  rifabutin  rifampin  rifapentine  St. John's wort  topiramate This list may not describe all possible interactions. Give your health care provider a list of all the medicines, herbs, non-prescription drugs, or dietary supplements you use. Also tell them if you smoke, drink alcohol, or use illegal drugs. Some items may interact with your medicine. What should I watch for while using this medicine? This product does not protect you against HIV infection (AIDS) or other sexually transmitted diseases. You should be able to feel the implant by pressing your fingertips over the skin where it was inserted. Contact your doctor if you cannot feel the implant, and use a non-hormonal birth control method (such as condoms) until your doctor confirms that the implant is in place. Contact your doctor if you think that the implant may have broken or become bent while in your arm. You will receive a user card from your health care provider after the implant is inserted. The card is a record of the location of the implant in your upper arm and when it should be removed. Keep this card with your health records. What side effects may I notice from receiving this medicine? Side effects that you should report to your doctor or health care professional as soon as possible:  allergic reactions like skin rash, itching or hives, swelling of the face, lips, or tongue  breast lumps, breast tissue changes, or discharge  breathing problems  changes in emotions or moods  if you feel that the implant may have broken or   bent while in your arm  high blood pressure  pain, irritation, swelling, or bruising at the insertion site  scar at site of insertion  signs of infection at the insertion site such as fever, and skin redness, pain or discharge  signs and  symptoms of a blood clot such as breathing problems; changes in vision; chest pain; severe, sudden headache; pain, swelling, warmth in the leg; trouble speaking; sudden numbness or weakness of the face, arm or leg  signs and symptoms of liver injury like dark yellow or brown urine; general ill feeling or flu-like symptoms; light-colored stools; loss of appetite; nausea; right upper belly pain; unusually weak or tired; yellowing of the eyes or skin  unusual vaginal bleeding, discharge Side effects that usually do not require medical attention (report to your doctor or health care professional if they continue or are bothersome):  acne  breast pain or tenderness  headache  irregular menstrual bleeding  nausea This list may not describe all possible side effects. Call your doctor for medical advice about side effects. You may report side effects to FDA at 1-800-FDA-1088. Where should I keep my medicine? This drug is given in a hospital or clinic and will not be stored at home. NOTE: This sheet is a summary. It may not cover all possible information. If you have questions about this medicine, talk to your doctor, pharmacist, or health care provider.  2020 Elsevier/Gold Standard (2017-05-11 14:11:42)

## 2019-01-03 LAB — GLUCOSE TOLERANCE, 2 HOURS
Glucose, 2 hour: 91 mg/dL (ref 65–139)
Glucose, GTT - Fasting: 93 mg/dL (ref 65–99)

## 2019-02-14 IMAGING — US US MFM OB FOLLOW-UP
1 series · 13 of 28 positions shown · non-contrast
Comparison: none

[Series 1: us mfm ob follow-up · 109 acquisitions, 13 frames shown]
[im 5/109]
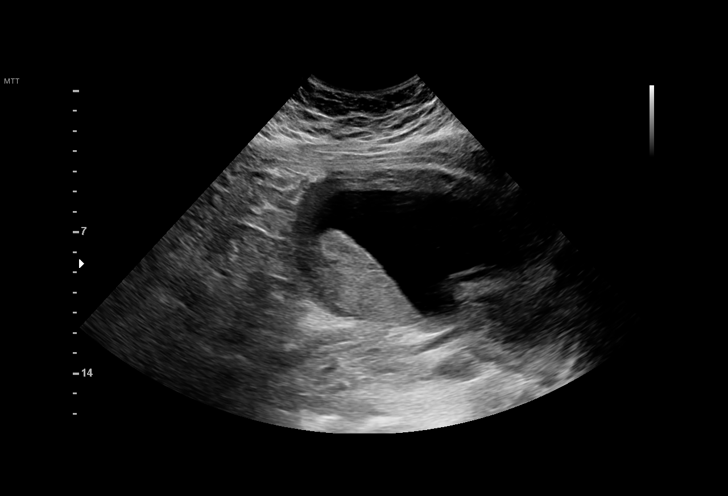
[im 13/109]
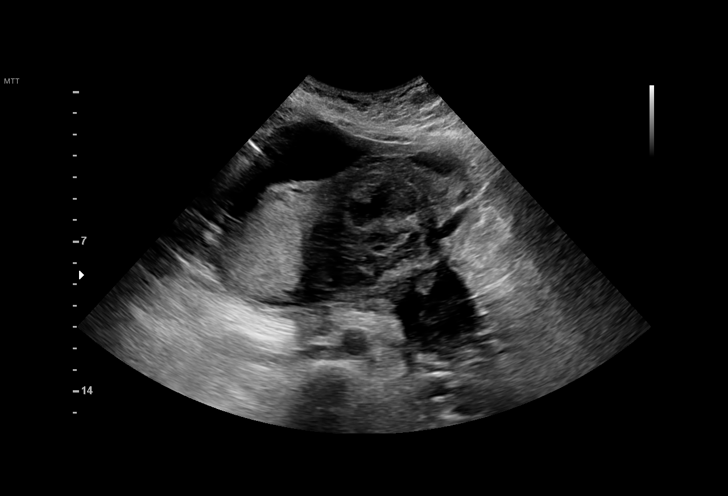
[im 21/109]
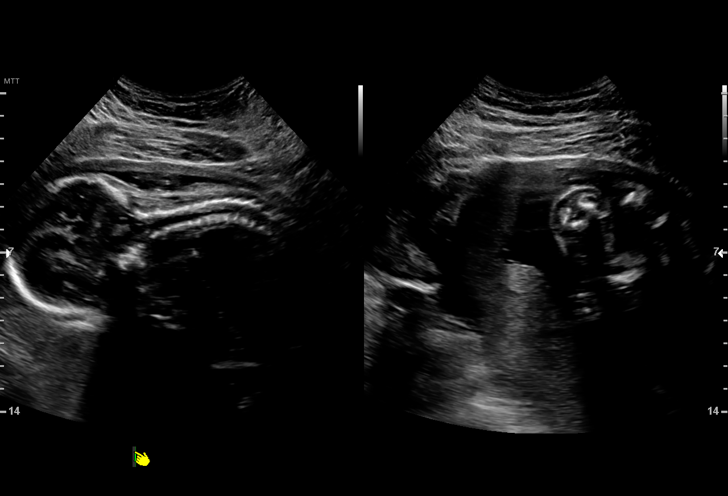
[im 29/109]
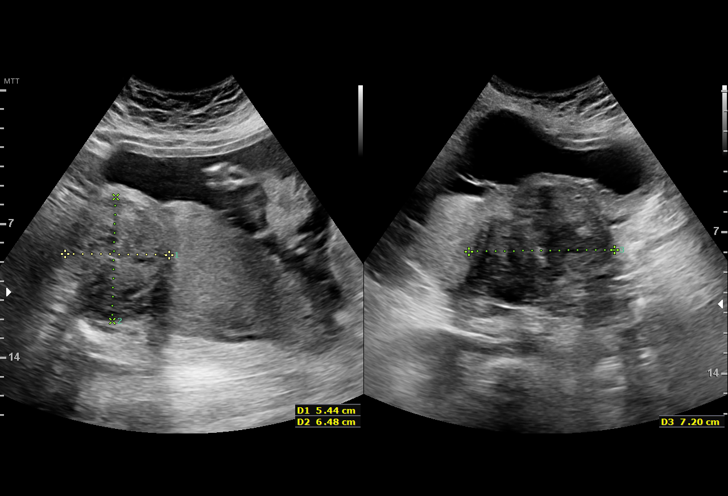
[im 37/109]
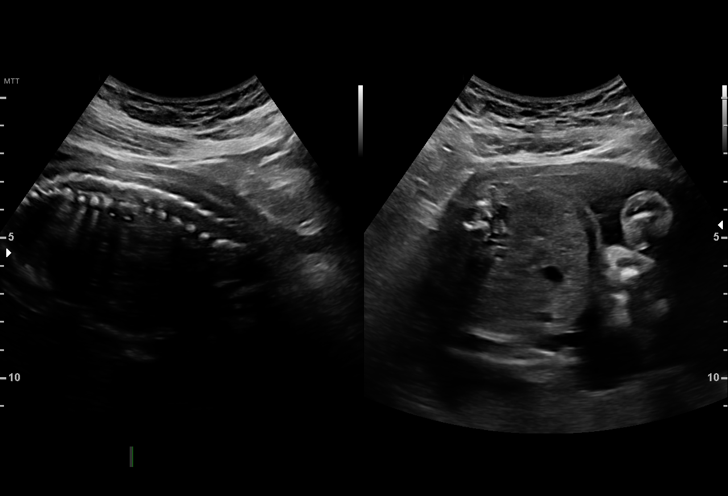
[im 45/109]
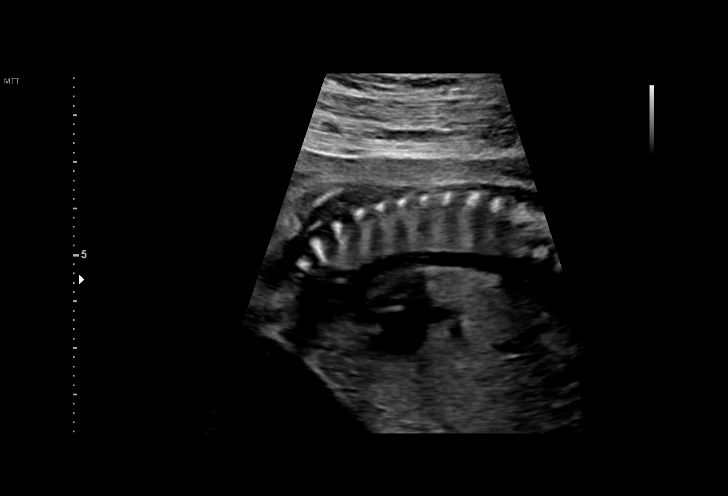
[im 57/109]
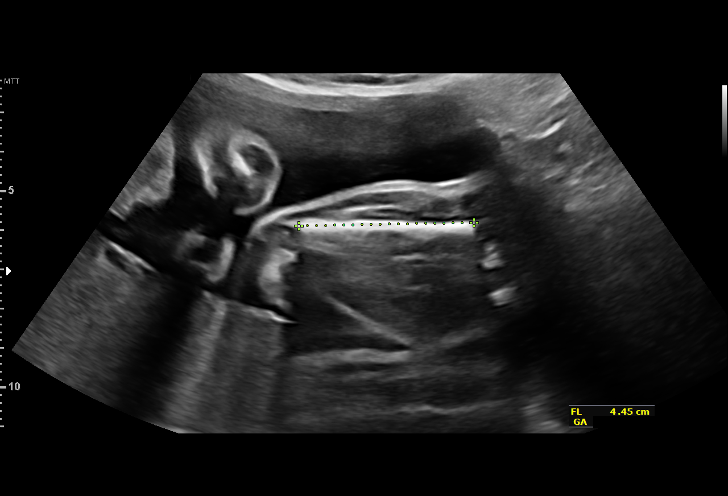
[im 65/109]
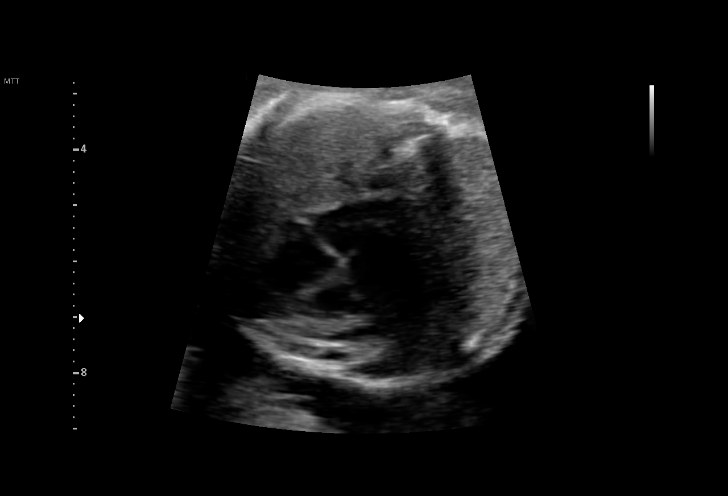
[im 73/109]
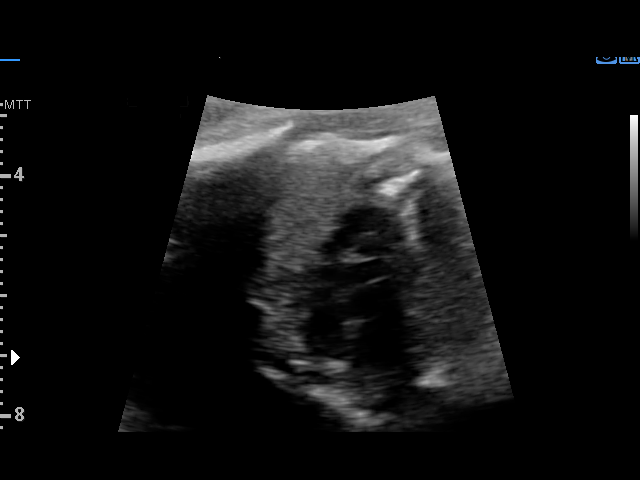
[im 81/109]
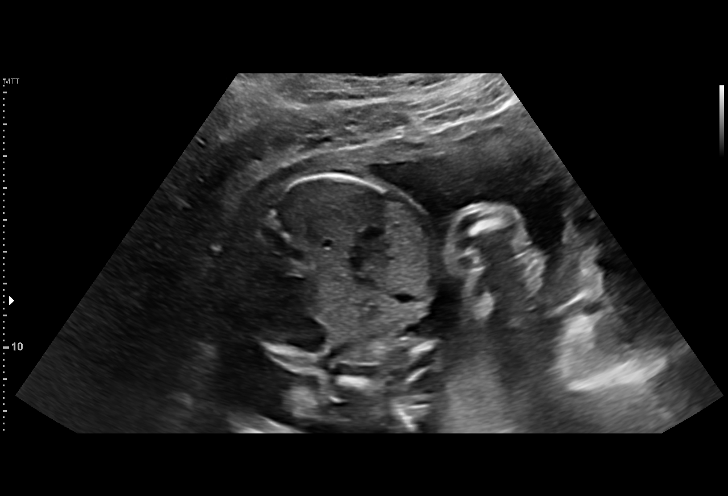
[im 89/109]
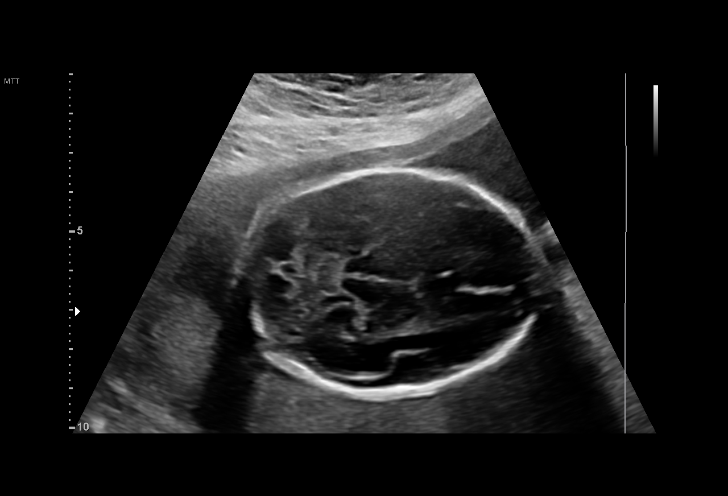
[im 97/109]
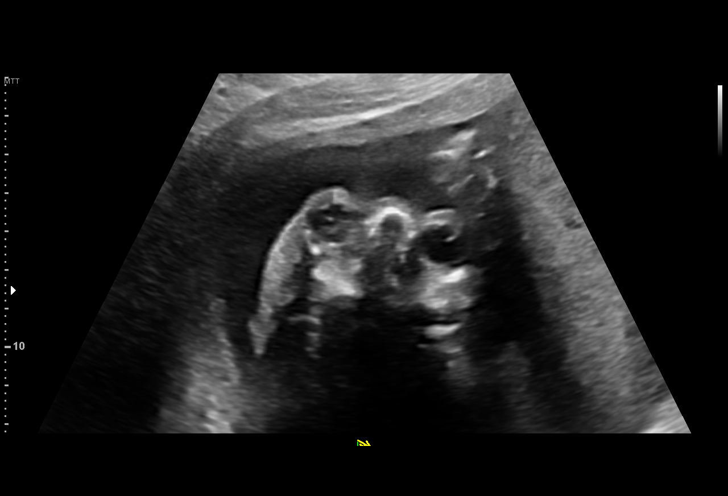
[im 105/109]
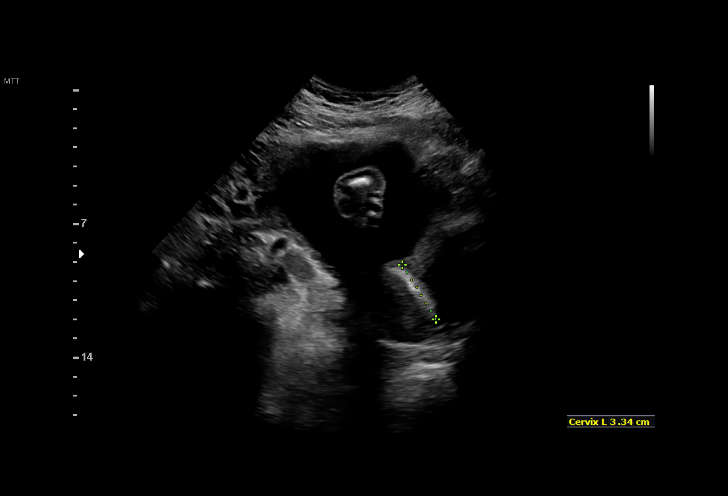

[13 of 28 positions shown; findings below may reference images not displayed]

[facility]
for [REDACTED]care
Ref. Address:     [address] [REDACTED]
Indications

24 weeks gestation of pregnancy
Obesity complicating pregnancy, second
trimester
Uterine fibroids affecting pregnancy in        O34.12,
second trimester, antepartum
Encounter for other antenatal screening
follow-up
Abnormal biochemical screen (Isolated
elevated AFP)
OB History

Blood Type:            Height:  5'8"   Weight (lb):  256      BMI:
Gravidity:    1
Fetal Evaluation

Num Of Fetuses:     1
Fetal Heart         157
Rate(bpm):
Cardiac Activity:   Observed
Presentation:       Breech
Placenta:           Posterior, above cervical os
P. Cord Insertion:  Previously Visualized
Amniotic Fluid
AFI FV:      Subjectively within normal limits

Largest Pocket(cm)
6.94
Biometry

BPD:      57.7  mm     G. Age:  23w 5d         14  %    CI:        72.76   %   70 - 86
FL/HC:      20.5   %   18.7 -
HC:      215.1  mm     G. Age:  23w 4d          7  %    HC/AC:      1.04       1.04 -
AC:      207.4  mm     G. Age:  25w 2d         64  %    FL/BPD:     76.4   %   71 - 87
FL:       44.1  mm     G. Age:  24w 4d         35  %    FL/AC:      21.3   %   20 - 24
HUM:      42.1  mm     G. Age:  25w 2d         61  %
CER:        25  mm     G. Age:  23w 0d         13  %
CM:        6.8  mm

Est. FW:     731  gm    1 lb 10 oz      56  %
Gestational Age

LMP:           24w 4d       Date:   08/10/16                 EDD:   05/17/17
U/S Today:     24w 2d                                        EDD:   05/19/17
Best:          24w 4d    Det. By:   LMP  (08/10/16)          EDD:   05/17/17
Anatomy

Cranium:               Appears normal         Aortic Arch:            Appears normal
Cavum:                 Previously seen        Ductal Arch:            Appears normal
Ventricles:            Appears normal         Diaphragm:              Appears normal
Choroid Plexus:        Previously seen        Stomach:                Appears normal, left
sided
Cerebellum:            Appears normal         Abdomen:                Previously seen
Posterior Fossa:       Previously seen        Abdominal Wall:         Previously seen
Nuchal Fold:           Previously seen        Cord Vessels:           Previously seen
Face:                  Orbits and profile     Kidneys:                Appear normal
previously seen
Lips:                  Appears normal         Bladder:                Appears normal
Thoracic:              Previously seen        Spine:                  Limited views
appear normal
Heart:                 Appears normal         Upper Extremities:      Previously seen
(4CH, axis, and situs
RVOT:                  Previously seen        Lower Extremities:      Previously seen
LVOT:                  Appears normal

Other:  Female gender previously seen. Heels previously seen.  5th digit
visualized. Technically difficult due to maternal habitus and fetal
position.
Cervix Uterus Adnexa

Cervix
Length:            3.3  cm.
Normal appearance by transabdominal scan.

Uterus
Multiple fibroids noted, see table below.

Left Ovary
Size(cm)     2.64  x    2.31   x  1.82      Vol(ml):
Within normal limits. No adnexal mass visualized.
Right Ovary
Size(cm)     2.36  x    2.95   x  2.53      Vol(ml):
Within normal limits. No adnexal mass visualized.

Cul De Sac:   No free fluid seen.

Adnexa:       No abnormality visualized.
Myomas

Site                     L(cm)      W(cm)      D(cm)      Location
Fundus Left
Fundus Left
Posterior

Blood Flow                 RI        PI       Comments

Impression

Single IUP at 24w 4d
Normal interval anatomy
The estimated fetal weight is at the 56th %tile
Multiple uterine myomas noted as described above
Posterior placenta without previa
Normal amniotic fluid volume
Recommendations

Recommend follow-up ultrasound examination for interval
growth in 6 weeks

## 2019-05-12 ENCOUNTER — Other Ambulatory Visit: Payer: Self-pay | Admitting: Obstetrics & Gynecology

## 2019-06-09 ENCOUNTER — Other Ambulatory Visit: Payer: Self-pay

## 2019-06-12 ENCOUNTER — Encounter: Payer: Self-pay | Admitting: Family Medicine

## 2019-06-12 ENCOUNTER — Other Ambulatory Visit: Payer: Self-pay

## 2019-06-12 ENCOUNTER — Ambulatory Visit (INDEPENDENT_AMBULATORY_CARE_PROVIDER_SITE_OTHER): Payer: 59 | Admitting: Family Medicine

## 2019-06-12 VITALS — BP 128/68 | HR 88 | Temp 96.5°F | Ht 68.0 in | Wt 312.0 lb

## 2019-06-12 DIAGNOSIS — Z0000 Encounter for general adult medical examination without abnormal findings: Secondary | ICD-10-CM | POA: Diagnosis not present

## 2019-06-12 NOTE — Progress Notes (Signed)
Chief Complaint  Patient presents with  . Annual Exam     Well Woman Maureen Griffin is here for a complete physical.   Her last physical was >1 year ago.  Current diet: in general, a "not good" diet. Current exercise: walking. Contraception? Yes No LMP recorded. Seatbelt? Yes  Health Maintenance Pap/HPV- Yes Tetanus- Yes HIV screening- Yes  Past Medical History:  Diagnosis Date  . Fibroid   . Gestational diabetes   . Hypertension   . Morbid obesity ([place]) 04/24/2016  . Pregnancy induced hypertension      Past Surgical History:  Procedure Laterality Date  . NO PAST SURGERIES      Medications  Current Outpatient Medications on File Prior to Visit  Medication Sig Dispense Refill  . Prenatal Vit-Fe Fumarate-FA (PRENATAL MULTIVITAMIN) TABS tablet Take 1 tablet by mouth daily at 12 noon.    [name] ibuprofen (ADVIL) 800 MG tablet TAKE 1 TABLET(800 MG) BY MOUTH THREE TIMES DAILY (Patient not taking: Reported on 06/12/2019) 30 tablet 0   Current Facility-Administered Medications on File Prior to Visit  Medication Dose Route Frequency Provider Last Rate Last Dose  . etonogestrel (NEXPLANON) implant 68 mg  68 mg Subdermal Once Woodroe Mode, MD         Allergies No Known Allergies  Review of Systems: Constitutional:  no fevers Eye:  no recent significant change in vision Ear/Nose/Mouth/Throat:  Ears:  no tinnitus or vertigo and no recent change in hearing Nose/Mouth/Throat:  no complaints of nasal congestion, no sore throat Cardiovascular: no chest pain Respiratory:  no cough and no shortness of breath Gastrointestinal:  no abdominal pain, no change in bowel habits GU:  Female: negative for dysuria or pelvic pain Musculoskeletal/Extremities:  no pain of the joints Integumentary (Skin/Breast):  no abnormal skin lesions reported Neurologic:  no headaches Endocrine:  denies fatigue Hematologic/Lymphatic:  No areas of easy bleeding  Exam BP 128/68 (BP Location: Left Arm,  Patient Position: Sitting, Cuff Size: Large)   Pulse 88   Temp (!) 96.5 F (35.8 C) (Temporal)   Ht '5\' 8"'$  (1.727 m)   Wt (!) 312 lb (141.5 kg)   SpO2 95%   BMI 47.44 kg/m  General:  well developed, well nourished, in no apparent distress Skin:  no significant moles, warts, or growths Head:  no masses, lesions, or tenderness Eyes:  pupils equal and round, sclera anicteric without injection Ears:  canals without lesions, TMs shiny without retraction, no obvious effusion, no erythema Nose:  nares patent, septum midline, mucosa normal, and no drainage or sinus tenderness Throat/Pharynx:  lips and gingiva without lesion; tongue and uvula midline; non-inflamed pharynx; no exudates or postnasal drainage Neck: neck supple without adenopathy, thyromegaly, or masses Lungs:  clear to auscultation, breath sounds equal bilaterally, no respiratory distress Cardio:  regular rate and rhythm, no bruits, no LE edema Abdomen:  abdomen soft, nontender; bowel sounds normal; no masses or organomegaly Genital: Defer to GYN Musculoskeletal:  symmetrical muscle groups noted without atrophy or deformity Extremities:  no clubbing, cyanosis, or edema, no deformities, no skin discoloration Neuro:  gait normal; deep tendon reflexes normal and symmetric Psych: well oriented with normal range of affect and appropriate judgment/insight  Assessment and Plan  Well adult exam - Plan: CBC, Comp Met (CMET), HgB A1c, Lipid Profile  Morbid obesity ([place])   Well 33 y.o. female. Counseled on diet and exercise. Goal weight loss by next year: 30-40 pounds; she did ask about weight loss medication but she  is currently breast-feeding.  I do not believe there is anything I would recommend while she is breast-feeding.  When she stops, she may make an appointment and we can discuss starting 1-2 months of phentermine followed up with Saxenda for longer term management.  We also discussed the medical weight loss clinic and bariatric  surgery. Other orders as above. Follow up in 1 yr or prn. The patient voiced understanding and agreement to the plan.  [place], DO 06/12/19 3:14 PM

## 2019-06-12 NOTE — Patient Instructions (Addendum)
Give [place] 2-3 business days to get the results of your labs back.   Keep the diet clean and stay active.  Aim to do some physical exertion for 150 minutes per week. This is typically divided into 5 days per week, 30 minutes per day. The activity should be enough to get your heart rate up. Anything is better than nothing if you have time constraints.  Goal weight for 1 year: 30-40 lb weight loss.   Let us know if you need anything.  Healthy Eating Plan Many factors influence your heart health, including eating and exercise habits. Heart (coronary) risk increases with abnormal blood fat (lipid) levels. Heart-healthy meal planning includes limiting unhealthy fats, increasing healthy fats, and making other small dietary changes. This includes maintaining a healthy body weight to help keep lipid levels within a normal range.  WHAT IS MY PLAN?  Your health care provider recommends that you:  Drink a glass of water before meals to help with satiety.  Eat slowly.  An alternative to the water is to add Metamucil. This will help with satiety as well. It does contain calories, unlike water.  WHAT TYPES OF FAT SHOULD I CHOOSE?  Choose healthy fats more often. Choose monounsaturated and polyunsaturated fats, such as olive oil and canola oil, flaxseeds, walnuts, almonds, and seeds.  Eat more omega-3 fats. Good choices include salmon, mackerel, sardines, tuna, flaxseed oil, and ground flaxseeds. Aim to eat fish at least two times each week.  Avoid foods with partially hydrogenated oils in them. These contain trans fats. Examples of foods that contain trans fats are stick margarine, some tub margarines, cookies, crackers, and other baked goods. If you are going to avoid a fat, this is the one to avoid!  WHAT GENERAL GUIDELINES DO I NEED TO FOLLOW?  Check food labels carefully to identify foods with trans fats. Avoid these types of options when possible.  Fill one half of your plate with vegetables  and green salads. Eat 4-5 servings of vegetables per day. A serving of vegetables equals 1 cup of raw leafy vegetables,  cup of raw or cooked cut-up vegetables, or  cup of vegetable juice.  Fill one fourth of your plate with whole grains. Look for the word "whole" as the first word in the ingredient list.  Fill one fourth of your plate with lean protein foods.  Eat 4-5 servings of fruit per day. A serving of fruit equals one medium whole fruit,  cup of dried fruit,  cup of fresh, frozen, or canned fruit. Try to avoid fruits in cups/syrups as the sugar content can be high.  Eat more foods that contain soluble fiber. Examples of foods that contain this type of fiber are apples, broccoli, carrots, beans, peas, and barley. Aim to get 20-30 g of fiber per day.  Eat more home-cooked food and less restaurant, buffet, and fast food.  Limit or avoid alcohol.  Limit foods that are high in starch and sugar.  Avoid fried foods when able.  Cook foods by using methods other than frying. Baking, boiling, grilling, and broiling are all great options. Other fat-reducing suggestions include: ? Removing the skin from poultry. ? Removing all visible fats from meats. ? Skimming the fat off of stews, soups, and gravies before serving them. ? Steaming vegetables in water or broth.  Lose weight if you are overweight. Losing just 5-10% of your initial body weight can help your overall health and prevent diseases such as diabetes and heart disease.  Increase your consumption of nuts, legumes, and seeds to 4-5 servings per week. One serving of dried beans or legumes equals  cup after being cooked, one serving of nuts equals 1 ounces, and one serving of seeds equals  ounce or 1 tablespoon.  WHAT ARE GOOD FOODS CAN I EAT? Grains Grainy breads (try to find bread that is 3 g of fiber per slice or greater), oatmeal, light popcorn. Whole-grain cereals. Rice and pasta, including brown rice and those that are made  with whole wheat. Edamame pasta is a great alternative to grain pasta. It has a higher protein content. Try to avoid significant consumption of white bread, sugary cereals, or pastries/baked goods.  Vegetables All vegetables. Cooked white potatoes do not count as vegetables.  Fruits All fruits, but limit pineapple and bananas as these fruits have a higher sugar content.  Meats and Other Protein Sources Lean, well-trimmed beef, veal, pork, and lamb. Chicken and [place] without skin. All fish and shellfish. Wild duck, rabbit, pheasant, and venison. Egg whites or low-cholesterol egg substitutes. Dried beans, peas, lentils, and tofu.Seeds and most nuts.  Dairy Low-fat or nonfat cheeses, including ricotta, string, and mozzarella. Skim or 1% milk that is liquid, powdered, or evaporated. Buttermilk that is made with low-fat milk. Nonfat or low-fat yogurt. Soy/Almond milk are good alternatives if you cannot handle dairy.  Beverages Water is the best for you. Sports drinks with less sugar are more desirable unless you are a highly active athlete.  Sweets and Desserts Sherbets and fruit ices. Honey, jam, marmalade, jelly, and syrups. Dark chocolate.  Eat all sweets and desserts in moderation.  Fats and Oils Nonhydrogenated (trans-free) margarines. Vegetable oils, including soybean, sesame, sunflower, olive, peanut, safflower, corn, canola, and cottonseed. Salad dressings or mayonnaise that are made with a vegetable oil. Limit added fats and oils that you use for cooking, baking, salads, and as spreads.  Other Cocoa powder. Coffee and tea. Most condiments.  The items listed above may not be a complete list of recommended foods or beverages. Contact your dietitian for more options.

## 2019-06-13 LAB — CBC
HCT: 37.9 % (ref 36.0–46.0)
Hemoglobin: 12.8 g/dL (ref 12.0–15.0)
MCHC: 33.7 g/dL (ref 30.0–36.0)
MCV: 82.7 fl (ref 78.0–100.0)
Platelets: 286 10*3/uL (ref 150.0–400.0)
RBC: 4.58 Mil/uL (ref 3.87–5.11)
RDW: 14.1 % (ref 11.5–15.5)
WBC: 10 10*3/uL (ref 4.0–10.5)

## 2019-06-13 LAB — COMPREHENSIVE METABOLIC PANEL
ALT: 19 U/L (ref 0–35)
AST: 15 U/L (ref 0–37)
Albumin: 4.1 g/dL (ref 3.5–5.2)
Alkaline Phosphatase: 76 U/L (ref 39–117)
BUN: 13 mg/dL (ref 6–23)
CO2: 28 mEq/L (ref 19–32)
Calcium: 9.6 mg/dL (ref 8.4–10.5)
Chloride: 103 mEq/L (ref 96–112)
Creatinine, Ser: 0.74 mg/dL (ref 0.40–1.20)
GFR: 109.42 mL/min (ref >60.00)
Glucose, Bld: 75 mg/dL (ref 70–99)
Potassium: 4.5 mEq/L (ref 3.5–5.1)
Sodium: 137 mEq/L (ref 135–145)
Total Bilirubin: 0.4 mg/dL (ref 0.2–1.2)
Total Protein: 7.2 g/dL (ref 6.0–8.3)

## 2019-06-13 LAB — LIPID PANEL
Cholesterol: 158 mg/dL (ref 0–200)
HDL: 50.5 mg/dL (ref >39.00)
LDL Cholesterol: 86 mg/dL (ref 0–99)
NonHDL: 107.93
Total CHOL/HDL Ratio: 3
Triglycerides: 112 mg/dL (ref 0.0–149.0)
VLDL: 22.4 mg/dL (ref 0.0–40.0)

## 2019-06-13 LAB — HEMOGLOBIN A1C: Hgb A1c MFr Bld: 5.3 % (ref 4.6–6.5)

## 2019-08-14 ENCOUNTER — Encounter: Payer: Self-pay | Admitting: Family Medicine

## 2019-08-14 ENCOUNTER — Other Ambulatory Visit: Payer: Self-pay | Admitting: Family Medicine

## 2019-08-14 MED ORDER — SAXENDA 18 MG/3ML ~~LOC~~ SOPN: 0.6000 mg | PEN_INJECTOR | Freq: Every day | SUBCUTANEOUS | 2 refills | Status: AC

## 2019-08-15 ENCOUNTER — Telehealth: Payer: Self-pay

## 2019-08-15 NOTE — Telephone Encounter (Signed)
PA initiated via Covermymeds; KEY: [id number]. Awaiting determination.

## 2019-08-17 NOTE — Telephone Encounter (Signed)
PA approved.   The request has been approved. The authorization is effective for a maximum of 4 fills from 08/17/2019 to 12/14/2019, as long as the member is enrolled in their current health plan. The request was approved as submitted. This request is approved for 35mL per 30 days. Renewal for Saxenda requires the patient has lost at least 4% of baseline body weight after 4 months of treatment. A written notification letter will follow with additional details.

## 2019-09-14 ENCOUNTER — Encounter: Payer: Self-pay | Admitting: Family Medicine

## 2019-09-14 NOTE — Telephone Encounter (Signed)
Pt is referring to [place].

## 2019-11-01 ENCOUNTER — Ambulatory Visit: Payer: 59 | Admitting: Family Medicine

## 2019-12-27 ENCOUNTER — Encounter: Payer: Self-pay | Admitting: Obstetrics & Gynecology

## 2019-12-27 ENCOUNTER — Ambulatory Visit (INDEPENDENT_AMBULATORY_CARE_PROVIDER_SITE_OTHER): Payer: 59 | Admitting: Obstetrics & Gynecology

## 2019-12-27 ENCOUNTER — Other Ambulatory Visit: Payer: Self-pay

## 2019-12-27 DIAGNOSIS — Z6841 Body Mass Index (BMI) 40.0 and over, adult: Secondary | ICD-10-CM

## 2019-12-27 NOTE — Patient Instructions (Signed)
Etonogestrel implant What is this medicine? ETONOGESTREL (et oh noe JES trel) is a contraceptive (birth control) device. It is used to prevent pregnancy. It can be used for up to 3 years. This medicine may be used for other purposes; ask your health care provider or pharmacist if you have questions. COMMON BRAND NAME(S): Implanon, Nexplanon What should I tell my health care provider before I take this medicine? They need to know if you have any of these conditions:  abnormal vaginal bleeding  blood vessel disease or blood clots  breast, cervical, endometrial, ovarian, liver, or uterine cancer  diabetes  gallbladder disease  heart disease or recent heart attack  high blood pressure  high cholesterol or triglycerides  kidney disease  liver disease  migraine headaches  seizures  stroke  tobacco smoker  an unusual or allergic reaction to etonogestrel, anesthetics or antiseptics, other medicines, foods, dyes, or preservatives  pregnant or trying to get pregnant  breast-feeding How should I use this medicine? This device is inserted just under the skin on the inner side of your upper arm by a health care professional. Talk to your pediatrician regarding the use of this medicine in children. Special care may be needed. Overdosage: If you think you have taken too much of this medicine contact a poison control center or emergency room at once. NOTE: This medicine is only for you. Do not share this medicine with others. What if I miss a dose? This does not apply. What may interact with this medicine? Do not take this medicine with any of the following medications:  amprenavir  fosamprenavir This medicine may also interact with the following medications:  acitretin  aprepitant  armodafinil  bexarotene  bosentan  carbamazepine  certain medicines for fungal infections like fluconazole, ketoconazole, itraconazole and voriconazole  certain medicines to treat  hepatitis, HIV or AIDS  cyclosporine  felbamate  griseofulvin  lamotrigine  modafinil  oxcarbazepine  phenobarbital  phenytoin  primidone  rifabutin  rifampin  rifapentine  St. John's wort  topiramate This list may not describe all possible interactions. Give your health care provider a list of all the medicines, herbs, non-prescription drugs, or dietary supplements you use. Also tell them if you smoke, drink alcohol, or use illegal drugs. Some items may interact with your medicine. What should I watch for while using this medicine? This product does not protect you against HIV infection (AIDS) or other sexually transmitted diseases. You should be able to feel the implant by pressing your fingertips over the skin where it was inserted. Contact your doctor if you cannot feel the implant, and use a non-hormonal birth control method (such as condoms) until your doctor confirms that the implant is in place. Contact your doctor if you think that the implant may have broken or become bent while in your arm. You will receive a user card from your health care provider after the implant is inserted. The card is a record of the location of the implant in your upper arm and when it should be removed. Keep this card with your health records. What side effects may I notice from receiving this medicine? Side effects that you should report to your doctor or health care professional as soon as possible:  allergic reactions like skin rash, itching or hives, swelling of the face, lips, or tongue  breast lumps, breast tissue changes, or discharge  breathing problems  changes in emotions or moods  coughing up blood  if you feel that the implant   may have broken or bent while in your arm  high blood pressure  pain, irritation, swelling, or bruising at the insertion site  scar at site of insertion  signs of infection at the insertion site such as fever, and skin redness, pain or  discharge  signs and symptoms of a blood clot such as breathing problems; changes in vision; chest pain; severe, sudden headache; pain, swelling, warmth in the leg; trouble speaking; sudden numbness or weakness of the face, arm or leg  signs and symptoms of liver injury like dark yellow or brown urine; general ill feeling or flu-like symptoms; light-colored stools; loss of appetite; nausea; right upper belly pain; unusually weak or tired; yellowing of the eyes or skin  unusual vaginal bleeding, discharge Side effects that usually do not require medical attention (report to your doctor or health care professional if they continue or are bothersome):  acne  breast pain or tenderness  headache  irregular menstrual bleeding  nausea This list may not describe all possible side effects. Call your doctor for medical advice about side effects. You may report side effects to FDA at 1-800-FDA-1088. Where should I keep my medicine? This drug is given in a hospital or clinic and will not be stored at home. NOTE: This sheet is a summary. It may not cover all possible information. If you have questions about this medicine, talk to your doctor, pharmacist, or health care provider.  2020 Elsevier/Gold Standard (2019-04-04 11:33:04)  

## 2019-12-27 NOTE — Progress Notes (Signed)
Patient ID: Maureen Griffin, female   DOB: December 06, 1985, 34 y.o.   MRN: [id number]  Chief Complaint  Patient presents with  . Follow-up  fatigue and hair loss  HPI Maureen Griffin is a 34 y.o. female.  [id number] Patient's last menstrual period was 12/13/2019 (approximate). Nexplanon in place for 12 mo. Now with 6 mo of fatigue, hair loss, and unable to lose weight despite efforts. She had some premenstrual dysphoria with this cycle.  [name] HPI  Past Medical History:  Diagnosis Date  . Fibroid   . Gestational diabetes   . Hypertension   . Morbid obesity ([place]) 04/24/2016  . Pregnancy induced hypertension     Past Surgical History:  Procedure Laterality Date  . NO PAST SURGERIES      Family History  Problem Relation Age of Onset  . Healthy Mother   . Cancer Neg Hx   . Diabetes Neg Hx   . Hypertension Neg Hx   . Stroke Neg Hx     Social History Social History   Tobacco Use  . Smoking status: Never Smoker  . Smokeless tobacco: Never Used  Vaping Use  . Vaping Use: Never used  Substance Use Topics  . Alcohol use: No  . Drug use: No    No Known Allergies  Current Outpatient Medications  Medication Sig Dispense Refill  . ibuprofen (ADVIL) 800 MG tablet TAKE 1 TABLET(800 MG) BY MOUTH THREE TIMES DAILY (Patient not taking: Reported on 06/12/2019) 30 tablet 0  . Prenatal Vit-Fe Fumarate-FA (PRENATAL MULTIVITAMIN) TABS tablet Take 1 tablet by mouth daily at 12 noon. (Patient not taking: Reported on 12/27/2019)     Current Facility-Administered Medications  Medication Dose Route Frequency Provider Last Rate Last Admin  . etonogestrel (NEXPLANON) implant 68 mg  68 mg Subdermal Once Woodroe Mode, MD        Review of Systems Review of Systems  Constitutional: Positive for fatigue.  Genitourinary: Negative for menstrual problem and pelvic pain.  Psychiatric/Behavioral: Positive for dysphoric mood.    Blood pressure 125/81, pulse (!) 56, weight (!) 309 lb 1.6 oz (140.2 kg),  last menstrual period 12/13/2019, currently breastfeeding.  Physical Exam Physical Exam Constitutional:      Appearance: She is obese. She is not ill-appearing.  Eyes:     Pupils: Pupils are equal, round, and reactive to light.  Neck:     Comments: No TM Pulmonary:     Effort: Pulmonary effort is normal.  Musculoskeletal:     Cervical back: Normal range of motion and neck supple.     Data Reviewed Labs show no thyroid studies  Assessment Somatic sx with Nexplanon in place Morbid obesity ([place]) - Plan: TSH   Plan Screen for thyroid disease. She is due to return for annual exam and this will be scheduled  15 min  Emeterio Reeve 12/27/2019, 8:40 AM

## 2019-12-27 NOTE — Progress Notes (Signed)
Pt is here with c/o hair loss and general malaise for the past 6 months, pt reports she thinks it is her Nexplanon she had placed about 1 year ago.

## 2019-12-28 LAB — TSH: TSH: 2.01 u[IU]/mL (ref 0.450–4.500)

## 2020-06-14 ENCOUNTER — Ambulatory Visit (INDEPENDENT_AMBULATORY_CARE_PROVIDER_SITE_OTHER): Payer: 59 | Admitting: Family Medicine

## 2020-06-14 ENCOUNTER — Other Ambulatory Visit: Payer: Self-pay

## 2020-06-14 ENCOUNTER — Encounter: Payer: Self-pay | Admitting: Family Medicine

## 2020-06-14 VITALS — BP 130/82 | HR 94 | Temp 99.3°F | Ht 68.0 in | Wt 305.1 lb

## 2020-06-14 DIAGNOSIS — Z0000 Encounter for general adult medical examination without abnormal findings: Secondary | ICD-10-CM | POA: Diagnosis not present

## 2020-06-14 DIAGNOSIS — Z13220 Encounter for screening for lipoid disorders: Secondary | ICD-10-CM

## 2020-06-14 LAB — COMPREHENSIVE METABOLIC PANEL
AG Ratio: 1.4 (calc) (ref 1.0–2.5)
ALT: 20 U/L (ref 6–29)
AST: 18 U/L (ref 10–30)
Albumin: 4.2 g/dL (ref 3.6–5.1)
Alkaline phosphatase (APISO): 70 U/L (ref 31–125)
BUN: 9 mg/dL (ref 7–25)
CO2: 27 mmol/L (ref 20–32)
Calcium: 9.7 mg/dL (ref 8.6–10.2)
Chloride: 104 mmol/L (ref 98–110)
Creat: 0.7 mg/dL (ref 0.50–1.10)
Globulin: 3.1 g/dL (calc) (ref 1.9–3.7)
Glucose, Bld: 87 mg/dL (ref 65–99)
Potassium: 4.9 mmol/L (ref 3.5–5.3)
Sodium: 139 mmol/L (ref 135–146)
Total Bilirubin: 0.4 mg/dL (ref 0.2–1.2)
Total Protein: 7.3 g/dL (ref 6.1–8.1)

## 2020-06-14 LAB — LIPID PANEL
Cholesterol: 159 mg/dL (ref <200)
HDL: 51 mg/dL (ref >=50)
LDL Cholesterol (Calc): 84 mg/dL (calc)
Non-HDL Cholesterol (Calc): 108 mg/dL (calc) (ref <130)
Total CHOL/HDL Ratio: 3.1 (calc) (ref <5.0)
Triglycerides: 137 mg/dL (ref <150)

## 2020-06-14 MED ORDER — OZEMPIC (0.25 OR 0.5 MG/DOSE) 2 MG/1.5ML ~~LOC~~ SOPN: PEN_INJECTOR | SUBCUTANEOUS | 1 refills | Status: DC

## 2020-06-14 NOTE — Patient Instructions (Addendum)
Call your gynecologist.   Give [place] 2-3 business days to get the results of your labs back.   Keep the diet clean and stay active.  Let me know if the medicine is too expensive.  Let us know if you need anything.

## 2020-06-14 NOTE — Progress Notes (Signed)
Chief Complaint  Patient presents with  . Annual Exam     Well Woman Maureen Griffin is here for a complete physical.   Her last physical was >1 year ago.  Current diet: in general, diet could be better. Current exercise: walking, workout videos. Contraception? Yes Fatigue out of ordinary? No Seatbelt? Yes  Health Maintenance Pap/HPV- No Tetanus- Yes HIV screening- Yes Hep C screening- Yes   Patient has been struggling with her weight for many years.  She has never been on any medication, when I tried to send in previously was not affordable.  Her diet is fair.  She does exercise nearly 5 times weekly.  She does mainly cardio.  She is interested in trying something to help.  Past Medical History:  Diagnosis Date  . Fibroid   . Gestational diabetes   . Hypertension   . Morbid obesity ([place]) 04/24/2016  . Pregnancy induced hypertension      Past Surgical History:  Procedure Laterality Date  . NO PAST SURGERIES      Medications  Current Outpatient Medications on File Prior to Visit  Medication Sig Dispense Refill  . ibuprofen (ADVIL) 800 MG tablet TAKE 1 TABLET(800 MG) BY MOUTH THREE TIMES DAILY 30 tablet 0  . Prenatal Vit-Fe Fumarate-FA (PRENATAL MULTIVITAMIN) TABS tablet Take 1 tablet by mouth daily at 12 noon.     Current Facility-Administered Medications on File Prior to Visit  Medication Dose Route Frequency Provider Last Rate Last Admin  . etonogestrel (NEXPLANON) implant 68 mg  68 mg Subdermal Once Woodroe Mode, MD         Allergies No Known Allergies  Review of Systems: Constitutional:  no unexpected weight changes Eye:  no recent significant change in vision Ear/Nose/Mouth/Throat:  Ears:  no tinnitus or vertigo and no recent change in hearing Nose/Mouth/Throat:  no complaints of nasal congestion, no sore throat Cardiovascular: no chest pain Respiratory:  no cough and no shortness of breath Gastrointestinal:  no abdominal pain, no change in bowel  habits GU:  Female: negative for dysuria or pelvic pain Musculoskeletal/Extremities:  no pain of the joints Integumentary (Skin/Breast):  no abnormal skin lesions reported Neurologic:  no headaches Endocrine:  denies fatigue Hematologic/Lymphatic:  No areas of easy bleeding  Exam BP 130/82 (BP Location: Left Arm, Patient Position: Sitting, Cuff Size: Large)   Pulse 94   Temp 99.3 F (37.4 C) (Oral)   Ht 5\' 8"  (1.727 m)   Wt (!) 305 lb 2 oz (138.4 kg)   SpO2 98%   BMI 46.39 kg/m  General:  well developed, well nourished, in no apparent distress Skin:  no significant moles, warts, or growths Head:  no masses, lesions, or tenderness Eyes:  pupils equal and round, sclera anicteric without injection Ears:  canals without lesions, TMs shiny without retraction, no obvious effusion, no erythema Nose:  nares patent, septum midline, mucosa normal, and no drainage or sinus tenderness Throat/Pharynx:  lips and gingiva without lesion; tongue and uvula midline; non-inflamed pharynx; no exudates or postnasal drainage Neck: neck supple without adenopathy, thyromegaly, or masses Lungs:  clear to auscultation, breath sounds equal bilaterally, no respiratory distress Cardio:  regular rate and rhythm, no bruits, no LE edema Abdomen:  abdomen soft, nontender; bowel sounds normal; no masses or organomegaly Genital: Defer to GYN Musculoskeletal:  symmetrical muscle groups noted without atrophy or deformity Extremities:  no clubbing, cyanosis, or edema, no deformities, no skin discoloration Neuro:  gait normal; deep tendon reflexes normal and  symmetric Psych: well oriented with normal range of affect and appropriate judgment/insight  Assessment and Plan  Well adult exam  Morbid obesity ([place]) - Plan: Semaglutide,0.25 or 0.5MG /DOS, (OZEMPIC, 0.25 OR 0.5 MG/DOSE,) 2 MG/1.5ML SOPN, Comprehensive metabolic panel  Screening cholesterol level - Plan: Lipid panel  Well 34 y.o. female. Counseled on diet  and exercise. Other orders as above. Start Ozempic for weight loss.  Counseled on side effects.  I would like to see her in 6 weeks to recheck her weight loss programs.  If insurance would not pay for this, we will send in Adipex. Of note, the patient has a history of gestational diabetes and hypertension, she does not have diabetes. The patient voiced understanding and agreement to the plan.  [place], DO 06/14/20 3:16 PM

## 2020-07-09 ENCOUNTER — Other Ambulatory Visit: Payer: Self-pay | Admitting: Family Medicine

## 2020-07-09 ENCOUNTER — Telehealth: Payer: Self-pay

## 2020-07-09 MED ORDER — SAXENDA 18 MG/3ML ~~LOC~~ SOPN: 0.6000 mg | PEN_INJECTOR | Freq: Every day | SUBCUTANEOUS | 2 refills | Status: AC

## 2020-07-09 NOTE — Telephone Encounter (Signed)
We can try a pill or daily shot. Ty.

## 2020-07-09 NOTE — Telephone Encounter (Signed)
Pt's plan doesn't cover Ozempic for weight loss only for type 2 diabetes.

## 2020-07-09 NOTE — Telephone Encounter (Signed)
Spoke to the patient and she is ok to try a shot

## 2020-07-10 NOTE — Telephone Encounter (Signed)
Patient is calling in reference to a prior auth. Patient needs a prior authorization for  Liraglutide -Weight Management (SAXENDA) 18 MG/3ML SOPN

## 2020-07-11 NOTE — Telephone Encounter (Signed)
PA initiated via Covermymeds; KEY: [id number]. Awaiting determination.

## 2020-07-15 ENCOUNTER — Telehealth: Payer: Self-pay | Admitting: Family Medicine

## 2020-07-15 NOTE — Telephone Encounter (Signed)
Patient needs prior Authorization   Medication: -Weight Management ([place]) 18 MG/3ML SOPN [[id number]] ENDED     Has the patient contacted their pharmacy? NO (If no, request that the patient contact the pharmacy for the refill.) (If yes, when and what did the pharmacy advise?)    Preferred Pharmacy (with phone number or street name):  [facility], [place] - [address]  [place], [place] [address]  Phone:  [phone] Fax:  [phone]      Agent: Please be advised that RX refills may take up to 3 business days. We ask that you follow-up with your pharmacy.

## 2020-07-15 NOTE — Telephone Encounter (Signed)
PA has been done//awaiting determination.

## 2020-07-18 NOTE — Telephone Encounter (Signed)
PA approved. Effective for maximum of 4 fills from 07/18/2020 to 11/14/2020. Renewal for Maureen Griffin will require that patient achieve and maintain at least 4% weight loss after 4 months of treatment.

## 2020-07-18 NOTE — Telephone Encounter (Signed)
PA approved.

## 2020-07-18 NOTE — Telephone Encounter (Signed)
Received additional questions from [place] regarding PA. Questions answered and faxed back to [phone].

## 2020-07-30 ENCOUNTER — Encounter: Payer: Self-pay | Admitting: Family Medicine

## 2020-07-30 ENCOUNTER — Other Ambulatory Visit: Payer: Self-pay

## 2020-07-30 ENCOUNTER — Ambulatory Visit: Payer: 59 | Admitting: Family Medicine

## 2020-07-30 MED ORDER — PHENTERMINE HCL 37.5 MG PO CAPS: 37.5000 mg | ORAL_CAPSULE | ORAL | 0 refills | Status: DC

## 2020-07-30 NOTE — Patient Instructions (Addendum)
Send me a message on MyChart in 4-6 weeks to check on on this.   Keep the diet clean and stay active.  Let us know if you need anything.

## 2020-07-30 NOTE — Progress Notes (Signed)
Chief Complaint  Patient presents with  . Follow-up    Subjective: Patient is a 35 y.o. female here for follow-up obesity.  The patient has been working to improve her diet and exercise routinely.  She has lost around 7 pounds since her last visit 6 weeks ago.  She was prescribed Ozempic that was helping, however insurance stopped paying for it.  She required a prior authorization for Saxenda but it was still nearly $1000.  She has not been taking anything recently.  She would like something.  Past Medical History:  Diagnosis Date  . Fibroid   . Gestational diabetes   . Hypertension   . Morbid obesity ([place]) 04/24/2016  . Pregnancy induced hypertension     Objective: BP 122/80 (BP Location: Left Arm, Patient Position: Sitting, Cuff Size: Large)   Pulse 74   Temp 98.1 F (36.7 C) (Oral)   Ht 5\' 8"  (1.727 m)   Wt 298 lb 8 oz (135.4 kg)   SpO2 100%   BMI 45.39 kg/m  General: Awake, appears stated age Lungs:No accessory muscle use Psych: Age appropriate judgment and insight, normal affect and mood  Assessment and Plan: Morbid obesity ([place]) - Plan: phentermine 37.5 MG capsule, phentermine 37.5 MG capsule, phentermine 37.5 MG capsule  We discussed the risks and benefits of phentermine.  We will do a 52-month course.  Because she is not on any medication today, she will send me a message in the next 4 to 6 weeks to check in.  She has Nexplanon for birth control. I will see her for her physical otherwise or as needed. The patient voiced understanding and agreement to the plan.  [place], DO 07/30/20  10:12 AM

## 2020-11-11 IMAGING — US US MFM FETAL BPP WO NON STRESS
1 series · 14 of 28 positions shown · non-contrast
Comparison: none

[Series 1: us mfm fetal bpp wo non stress · 46 acquisitions, 14 frames shown]
[im 2/46]
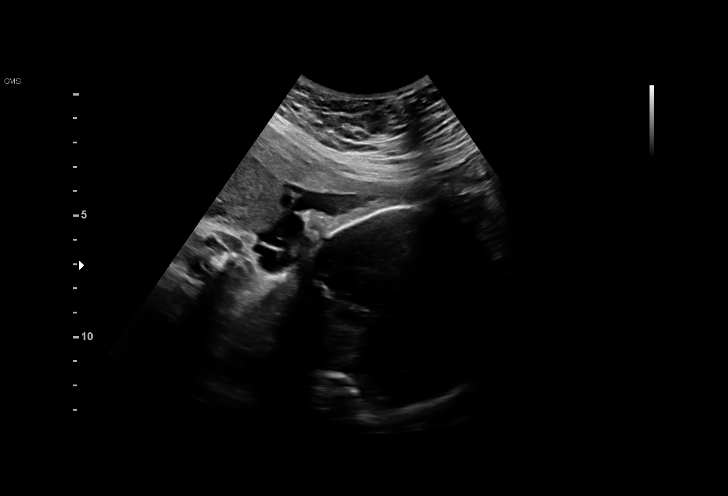
[im 6/46]
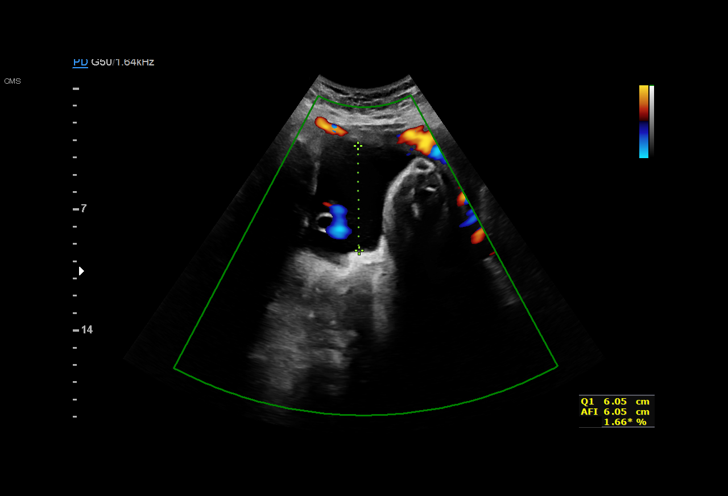
[im 9/46]
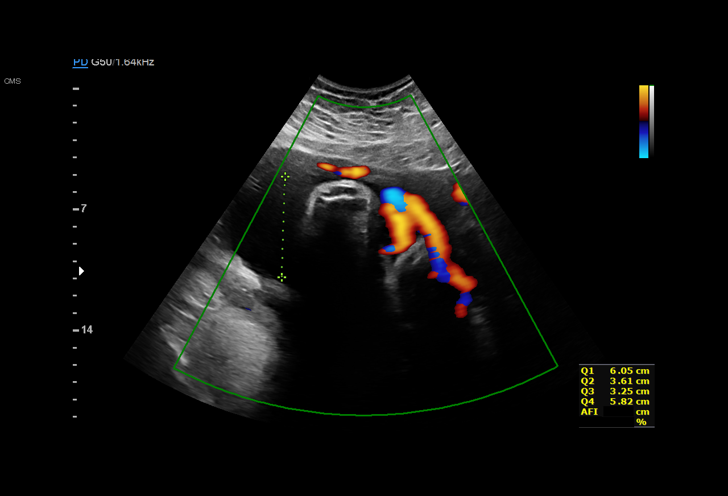
[im 12/46]
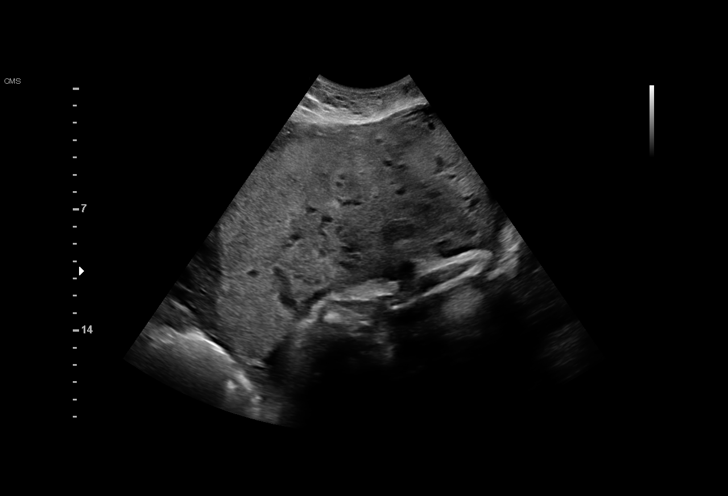
[im 16/46]
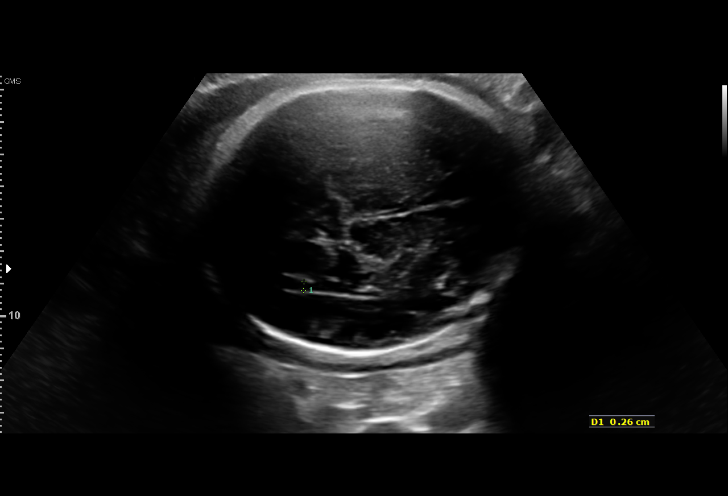
[im 19/46]
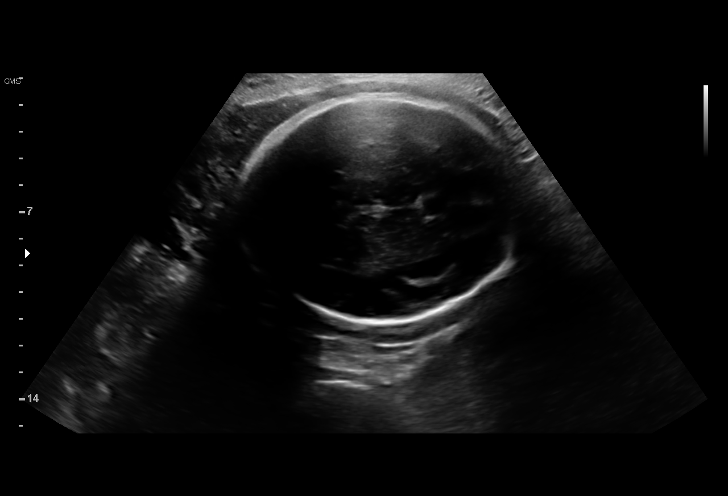
[im 22/46]
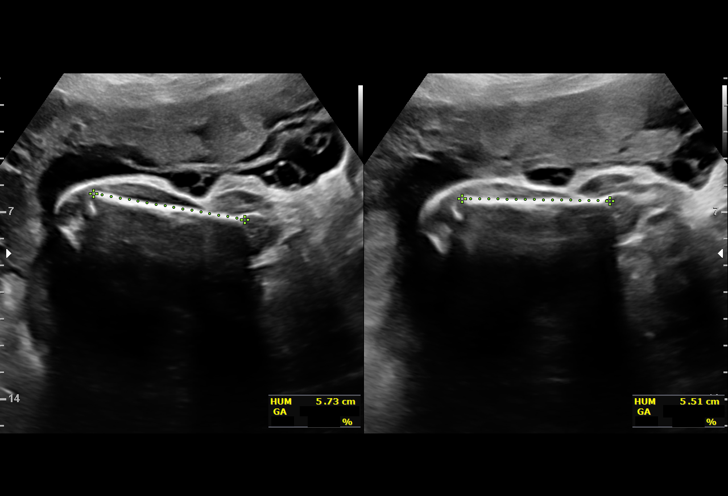
[im 26/46]
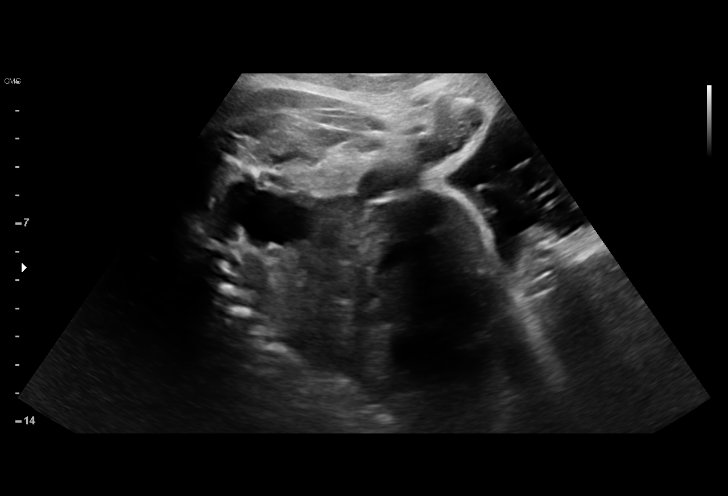
[im 29/46]
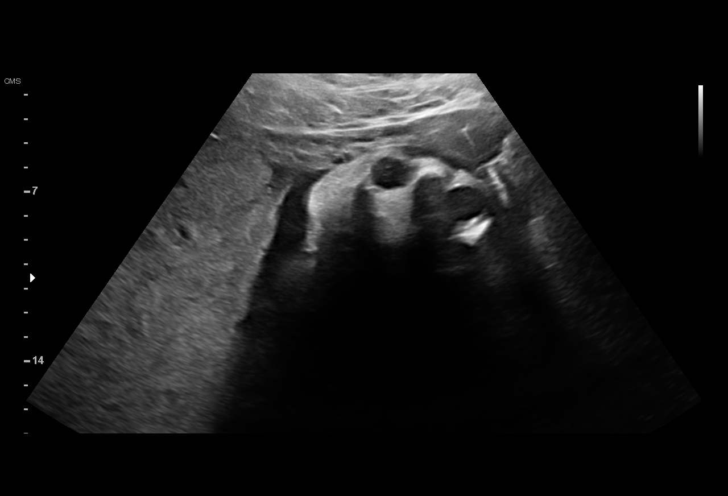
[im 32/46]
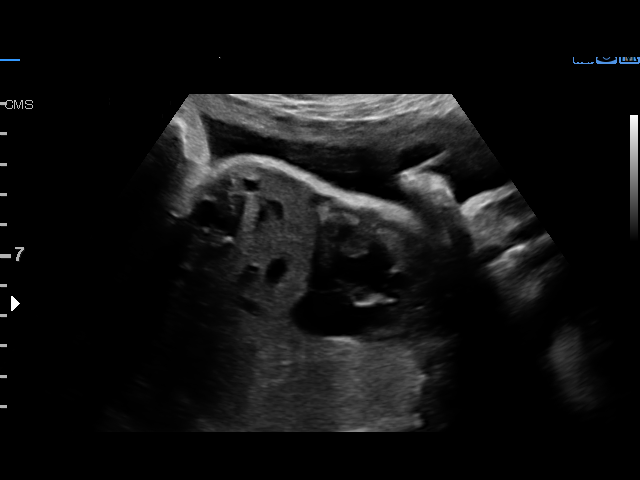
[im 36/46]
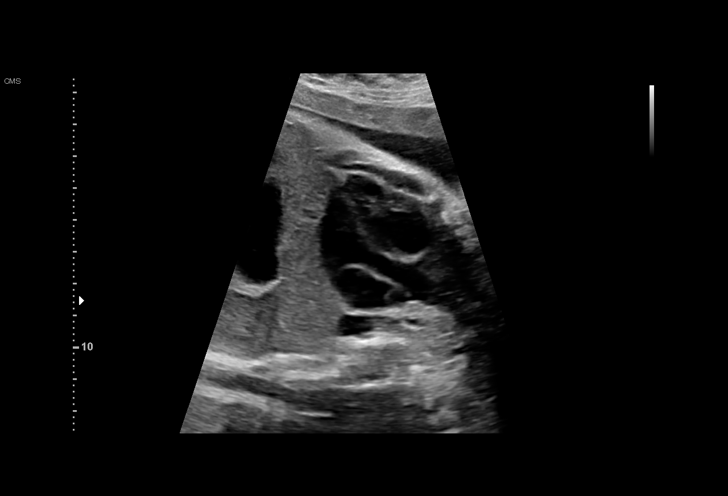
[im 39/46]
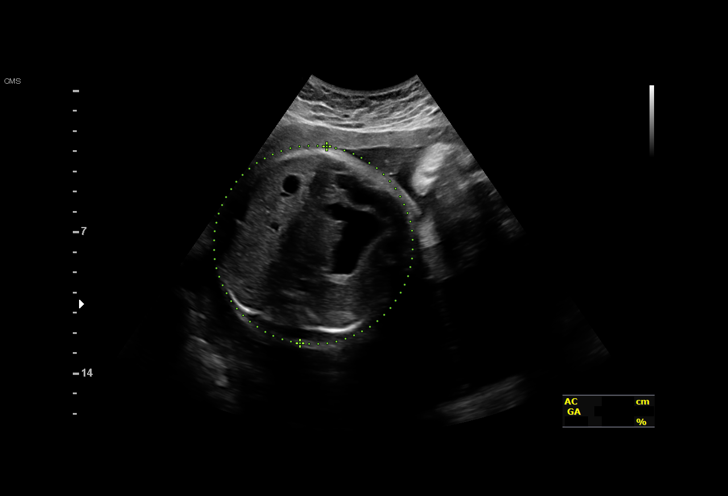
[im 42/46]
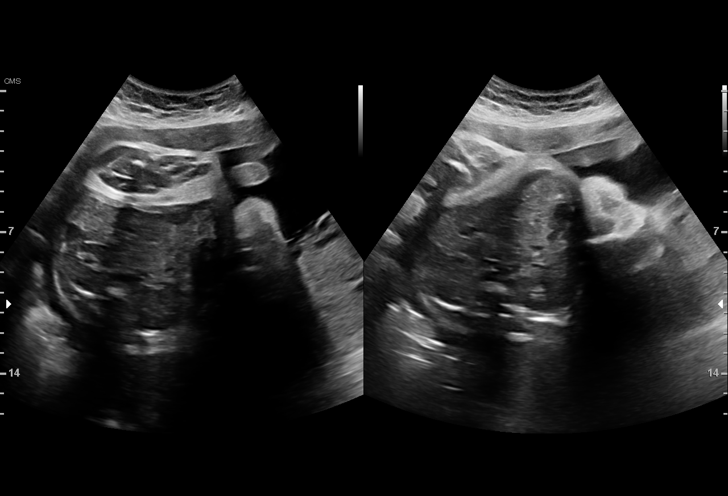
[im 46/46]
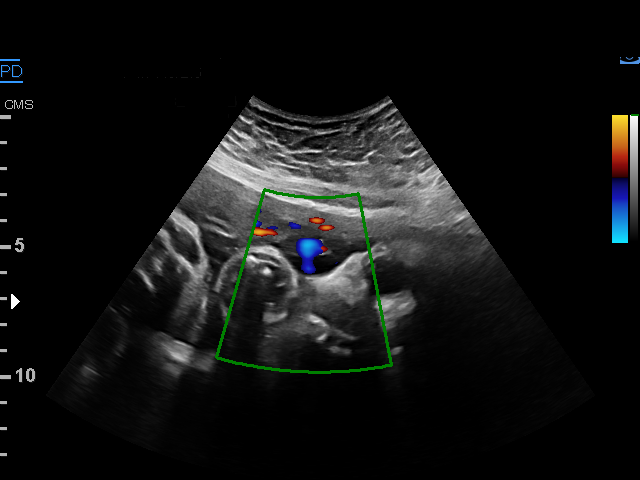

[14 of 28 positions shown; findings below may reference images not displayed]

----------------------------------------------------------------------

 ----------------------------------------------------------------------
Indications

  Encounter for other antenatal screening
  follow-up
  Maternal morbid obesity
  Uterine fibroids
  Abnormal finding on antenatal screening
  (Increased AFP, Low Risk NIPS)
  Gestational diabetes in pregnancy,
  controlled by oral hypoglycemic drugs
  (metformin)
  Hypertension - Chronic/Pre-existing
  (aldomet)
  35 weeks gestation of pregnancy
 ----------------------------------------------------------------------
Vital Signs

                                                Height:        5'8"
Fetal Evaluation

 Num Of Fetuses:         1
 Fetal Heart Rate(bpm):  134
 Cardiac Activity:       Observed
 Presentation:           Cephalic
 Placenta:               Anterior
 P. Cord Insertion:      Visualized

 Amniotic Fluid
 AFI FV:      Within normal limits

 AFI Sum(cm)     %Tile       Largest Pocket(cm)
 18.73           70
 RUQ(cm)       RLQ(cm)       LUQ(cm)        LLQ(cm)

Biophysical Evaluation

 Amniotic F.V:   Within normal limits       F. Tone:        Observed
 F. Movement:    Observed                   Score:          [DATE]
 F. Breathing:   Observed
Biometry

 BPD:      84.7  mm     G. Age:  34w 1d         21  %    CI:        74.78   %    70 - 86
                                                         FL/HC:      20.3   %    20.1 -
 HC:      310.8  mm     G. Age:  34w 5d         10  %    HC/AC:      1.01        0.93 -
 AC:      308.6  mm     G. Age:  34w 5d         44  %    FL/BPD:     74.5   %    71 - 87
 FL:       63.1  mm     G. Age:  32w 5d        < 3  %    FL/AC:      20.4   %    20 - 24
 HUM:      55.5  mm     G. Age:  32w 2d          7  %

 Est. FW:    5871  gm      5 lb 3 oz     37  %
OB History

 Gravidity:    2         Term:   1
 Living:       1
Gestational Age

 LMP:           35w 2d        Date:  02/22/18                 EDD:   11/29/18
 U/S Today:     34w 1d                                        EDD:   12/07/18
 Best:          35w 2d     Det. By:  LMP  (02/22/18)          EDD:   11/29/18
Anatomy

 Cranium:               Appears normal         Aortic Arch:            Previously seen
 Cavum:                 Appears normal         Ductal Arch:            Previously seen
 Ventricles:            Appears normal         Diaphragm:              Previously seen
 Choroid Plexus:        Previously seen        Stomach:                Appears normal, left
                                                                       sided
 Cerebellum:            Previously seen        Abdomen:                Appears normal
 Posterior Fossa:       Previously seen        Abdominal Wall:         Previously seen
 Nuchal Fold:           Not applicable (>20    Cord Vessels:           Previously seen
                        wks GA)
 Face:                  Orbits and profile     Kidneys:                Appear normal
                        previously seen
 Lips:                  Previously seen        Bladder:                Appears normal
 Thoracic:              Appears normal         Spine:                  Previously seen
 Heart:                 Appears normal         Upper Extremities:      Previously seen
                        (4CH, axis, and
                        situs)
 RVOT:                  Appears normal         Lower Extremities:      Previously seen
 LVOT:                  Previously seen

 Other:  Hands, feet, and Nasal bone previously visualized. Technically
         difficult due to maternal habitus.
Impression

 Normal interval growth.
 Biophysical profile [DATE]
Recommendations

 Continue weekly biophysical profiles

## 2020-12-04 ENCOUNTER — Ambulatory Visit: Payer: 59 | Admitting: Family Medicine

## 2020-12-21 ENCOUNTER — Encounter (HOSPITAL_COMMUNITY): Payer: Self-pay

## 2020-12-21 ENCOUNTER — Ambulatory Visit (HOSPITAL_COMMUNITY)
Admission: EM | Admit: 2020-12-21 | Discharge: 2020-12-21 | Disposition: A | Discharge status: Home / Self Care | Payer: 59 | Attending: Family Medicine | Admitting: Family Medicine

## 2020-12-21 DIAGNOSIS — S61212A Laceration without foreign body of right middle finger without damage to nail, initial encounter: Secondary | ICD-10-CM | POA: Diagnosis not present

## 2020-12-21 DIAGNOSIS — M79644 Pain in right finger(s): Secondary | ICD-10-CM

## 2020-12-21 MED ORDER — LIDOCAINE HCL 2 % IJ SOLN
INTRAMUSCULAR | Status: AC
  Filled 2020-12-21: qty 20

## 2020-12-21 NOTE — Discharge Instructions (Addendum)
Follow up in about 7 days for suture removal  Follow up before if you notice any redness, discharge, heat, swelling around the area  Follow up with this office or with primary care if symptoms are persisting.  Follow up in the ER for high fever, trouble swallowing, trouble breathing, other concerning symptoms.

## 2020-12-21 NOTE — ED Provider Notes (Signed)
[facility]    CSN: [id number] Arrival date & time: 12/21/20  1247      History   Chief Complaint Chief Complaint  Patient presents with   Laceration    HPI Maureen Griffin is a 35 y.o. female.   Reports that she was cutting [place] and that she cut her right middle finger. Has rinsed the area with water at home. Has had tdap vaccine within the last 3 years. Denies previous symptoms. Denies uncontrolled bleeding, foreign body to the area, numbness, tingling, other symptoms. Incident occurred about 2 hours ago.  ROS per HPI  The history is provided by the patient.  Laceration  Past Medical History:  Diagnosis Date   Fibroid    Gestational diabetes    Hypertension    Morbid obesity ([place]) 04/24/2016   Pregnancy induced hypertension     Patient Active Problem List   Diagnosis Date Noted   GERD (gastroesophageal reflux disease) 08/04/2018   Morbid obesity ([place]) 04/24/2016    Past Surgical History:  Procedure Laterality Date   NO PAST SURGERIES      OB History     Gravida  2   Para  2   Term  2   Preterm      AB      Living  2      SAB      IAB      Ectopic      Multiple  0   Live Births  2            Home Medications    Prior to Admission medications   Medication Sig Start Date End Date Taking? Authorizing Provider  phentermine 37.5 MG capsule Take 1 capsule (37.5 mg total) by mouth every morning. 07/30/20   Shelda Pal, DO  phentermine 37.5 MG capsule Take 1 capsule (37.5 mg total) by mouth every morning. 08/29/20   Nani Ravens, Crosby Oyster, DO  phentermine 37.5 MG capsule Take 1 capsule (37.5 mg total) by mouth every morning. 09/28/20   Shelda Pal, DO  Prenatal Vit-Fe Fumarate-FA (PRENATAL MULTIVITAMIN) TABS tablet Take 1 tablet by mouth daily at 12 noon.    [provider]    Family History Family History  Problem Relation Age of Onset   Healthy Mother    Cancer Neg Hx    Diabetes Neg Hx     Hypertension Neg Hx    Stroke Neg Hx     Social History Social History   Tobacco Use   Smoking status: Never   Smokeless tobacco: Never  Vaping Use   Vaping Use: Never used  Substance Use Topics   Alcohol use: No   Drug use: No     Allergies   Patient has no known allergies.   Review of Systems Review of Systems   Physical Exam Triage Vital Signs ED Triage Vitals  Enc Vitals Group     BP 12/21/20 1319 138/74     Pulse Rate 12/21/20 1319 68     Resp 12/21/20 1319 19     Temp 12/21/20 1319 99.2 F (37.3 C)     Temp Source 12/21/20 1319 Oral     SpO2 12/21/20 1319 100 %     Weight --      Height --      Head Circumference --      Peak Flow --      Pain Score 12/21/20 1318 2     Pain Loc --  Pain Edu? --      Excl. in [place]? --    No data found.  Updated Vital Signs BP 138/74 (BP Location: Left Arm)   Pulse 68   Temp 99.2 F (37.3 C) (Oral)   Resp 19   LMP 12/05/2020 (Exact Date)   SpO2 100%   Visual Acuity Right Eye Distance:   Left Eye Distance:   Bilateral Distance:    Right Eye Near:   Left Eye Near:    Bilateral Near:     Physical Exam Vitals and nursing note reviewed.  Constitutional:      General: She is not in acute distress.    Appearance: Normal appearance. She is well-developed.  HENT:     Head: Normocephalic and atraumatic.     Nose: Nose normal.     Mouth/Throat:     Mouth: Mucous membranes are moist.     Pharynx: Oropharynx is clear.  Eyes:     Extraocular Movements: Extraocular movements intact.     Conjunctiva/sclera: Conjunctivae normal.     Pupils: Pupils are equal, round, and reactive to light.  Cardiovascular:     Rate and Rhythm: Normal rate and regular rhythm.  Pulmonary:     Effort: Pulmonary effort is normal. No respiratory distress.  Musculoskeletal:        General: Normal range of motion.     Cervical back: Normal range of motion and neck supple.  Skin:    General: Skin is warm and dry.     Capillary  Refill: Capillary refill takes less than 2 seconds.     Findings: Laceration present.     Comments: 1.5cm laceration to palmar side and medial aspect of the right middle finger  Neurological:     General: No focal deficit present.     Mental Status: She is alert and oriented to person, place, and time.  Psychiatric:        Mood and Affect: Mood normal.        Behavior: Behavior normal.        Thought Content: Thought content normal.     UC Treatments / Results  Labs (all labs ordered are listed, but only abnormal results are displayed) Labs Reviewed - No data to display  EKG   Radiology No results found.  Procedures Laceration Repair  Date/Time: 12/21/2020 2:02 PM Performed by: Faustino Congress, NP Authorized by: Faustino Congress, NP   Consent:    Consent obtained:  Verbal   Consent given by:  Patient   Risks, benefits, and alternatives were discussed: yes     Risks discussed:  Infection, pain and poor cosmetic result Universal protocol:    Procedure explained and questions answered to patient or proxy's satisfaction: yes     Patient identity confirmed:  Verbally with patient and arm band Anesthesia:    Anesthesia method:  Local infiltration   Local anesthetic:  Lidocaine 2% w/o epi Laceration details:    Location:  Finger   Finger location:  R long finger   Length (cm):  1.5   Depth (mm):  3 Pre-procedure details:    Preparation:  Patient was prepped and draped in usual sterile fashion Exploration:    Limited defect created (wound extended): yes     Hemostasis achieved with:  Direct pressure   Imaging outcome: foreign body not noted     Wound exploration: wound explored through full range of motion     Contaminated: no   Treatment:    Area cleansed with:  Shur-Clens   Amount of cleaning:  Standard   Irrigation solution:  Sterile water   Irrigation volume:  10 mL   Irrigation method:  Syringe   Visualized foreign bodies/material removed: no      Debridement:  None   Undermining:  None   Scar revision: no   Skin repair:    Repair method:  Sutures   Suture size:  4-0   Suture material:  Prolene   Suture technique:  Simple interrupted   Number of sutures:  4 Approximation:    Approximation:  Close Repair type:    Repair type:  Simple Post-procedure details:    Dressing:  Non-adherent dressing   Procedure completion:  Tolerated well, no immediate complications (including critical care time)  Medications Ordered in UC Medications - No data to display  Initial Impression / Assessment and Plan / UC Course  I have reviewed the triage vital signs and the nursing notes.  Pertinent labs & imaging results that were available during my care of the patient were reviewed by me and considered in my medical decision making (see chart for details).    Right finger pain Laceration to right middle finger  Laceration repaired as above Tolerated very well Follow up in 7 days for suture removal Follow up sooner if you notice any increased redness, swelling, heat, drainage from the area Follow up with this office or with primary care if symptoms are persisting.  Follow up in the ER for high fever, trouble swallowing, trouble breathing, other concerning symptoms.   Final Clinical Impressions(s) / UC Diagnoses   Final diagnoses:  Laceration of right middle finger without foreign body without damage to nail, initial encounter  Finger pain, right     Discharge Instructions      Follow up in about 7 days for suture removal  Follow up before if you notice any redness, discharge, heat, swelling around the area  Follow up with this office or with primary care if symptoms are persisting.  Follow up in the ER for high fever, trouble swallowing, trouble breathing, other concerning symptoms.      ED Prescriptions   None    PDMP not reviewed this encounter.   Faustino Congress, NP 12/21/20 [phone]

## 2020-12-21 NOTE — ED Triage Notes (Signed)
Pt in with c/o right middle finger laceration that occurred today when she cut herself with a knife  Denies any numbness/tingling in finger  Pt states the bleeding was very bad

## 2020-12-24 ENCOUNTER — Other Ambulatory Visit: Payer: Self-pay

## 2020-12-24 ENCOUNTER — Encounter: Payer: Self-pay | Admitting: Obstetrics

## 2020-12-24 ENCOUNTER — Other Ambulatory Visit (HOSPITAL_COMMUNITY)
Admission: RE | Admit: 2020-12-24 | Discharge: 2020-12-24 | Disposition: A | Discharge status: Home / Self Care | Payer: 59 | Source: Ambulatory Visit | Attending: Obstetrics | Admitting: Obstetrics

## 2020-12-24 ENCOUNTER — Ambulatory Visit (INDEPENDENT_AMBULATORY_CARE_PROVIDER_SITE_OTHER): Payer: 59 | Admitting: Obstetrics

## 2020-12-24 VITALS — BP 124/78 | HR 60 | Ht 68.0 in | Wt 293.0 lb

## 2020-12-24 DIAGNOSIS — Z124 Encounter for screening for malignant neoplasm of cervix: Secondary | ICD-10-CM | POA: Diagnosis not present

## 2020-12-24 DIAGNOSIS — Z6841 Body Mass Index (BMI) 40.0 and over, adult: Secondary | ICD-10-CM

## 2020-12-24 DIAGNOSIS — B369 Superficial mycosis, unspecified: Secondary | ICD-10-CM

## 2020-12-24 DIAGNOSIS — Z01419 Encounter for gynecological examination (general) (routine) without abnormal findings: Secondary | ICD-10-CM

## 2020-12-24 DIAGNOSIS — Z3046 Encounter for surveillance of implantable subdermal contraceptive: Secondary | ICD-10-CM

## 2020-12-24 MED ORDER — CLOTRIMAZOLE-BETAMETHASONE 1-0.05 % EX CREA: 1.0000 "application " | TOPICAL_CREAM | Freq: Two times a day (BID) | CUTANEOUS | 1 refills | Status: DC

## 2020-12-24 NOTE — Progress Notes (Signed)
Pateint presents for Annual Exam today. Currently taking Rx for weight loss management by PCP.  LMP:12/05/20 pt sttaes cycle lasted 2.5 weeks  Last pap:09/21/2016  STD Screening: declines  Family Hx of Breast Cancer: None  Contraception: Nexplanon  CC: skin rashes pt unsure what is causing skin irritation

## 2020-12-24 NOTE — Progress Notes (Signed)
Subjective:        Maureen Griffin is a 35 y.o. female here for a routine exam.  Current complaints: Skin rash that itches..    Personal health questionnaire:  Is patient Ashkenazi Jewish, have a family history of breast and/or ovarian cancer: no Is there a family history of uterine cancer diagnosed at age < 76, gastrointestinal cancer, urinary tract cancer, family member who is a Field seismologist syndrome-associated carrier: no Is the patient overweight and hypertensive, family history of diabetes, personal history of gestational diabetes, preeclampsia or PCOS: no Is patient over 59, have PCOS,  family history of premature CHD under age 70, diabetes, smoke, have hypertension or peripheral artery disease:  no At any time, has a partner hit, kicked or otherwise hurt or frightened you?: no Over the past 2 weeks, have you felt down, depressed or hopeless?: no Over the past 2 weeks, have you felt little interest or pleasure in doing things?:no   Gynecologic History Patient's last menstrual period was 12/05/2020 (exact date). Contraception: Nexplanon Last Pap: 2018. Results were: normal Last mammogram: n/a. Results were: n/a  Obstetric History OB History  Gravida Para Term Preterm AB Living  2 2 2     2   SAB IAB Ectopic Multiple Live Births        0 2    # Outcome Date GA Lbr Len/2nd Weight Sex Delivery Anes PTL Lv  2 Term 11/21/18 [redacted]w[redacted]d   M Vag-Spont   LIV  1 Term 05/02/17 [redacted]w[redacted]d 09:07 / 00:33 6 lb 1.7 oz (2.77 kg) F Vag-Spont EPI  LIV    Past Medical History:  Diagnosis Date   Fibroid    Gestational diabetes    Hypertension    Morbid obesity ([place]) 04/24/2016   Pregnancy induced hypertension     Past Surgical History:  Procedure Laterality Date   NO PAST SURGERIES       Current Outpatient Medications:    phentermine 37.5 MG capsule, Take 1 capsule (37.5 mg total) by mouth every morning., Disp: 30 capsule, Rfl: 0   phentermine 37.5 MG capsule, Take 1 capsule (37.5 mg total) by  mouth every morning., Disp: 30 capsule, Rfl: 0   Prenatal Vit-Fe Fumarate-FA (PRENATAL MULTIVITAMIN) TABS tablet, Take 1 tablet by mouth daily at 12 noon., Disp: , Rfl:    phentermine 37.5 MG capsule, Take 1 capsule (37.5 mg total) by mouth every morning., Disp: 30 capsule, Rfl: 0  Current Facility-Administered Medications:    etonogestrel (NEXPLANON) implant 68 mg, 68 mg, Subdermal, Once, Woodroe Mode, MD No Known Allergies  Social History   Tobacco Use   Smoking status: Never   Smokeless tobacco: Never  Substance Use Topics   Alcohol use: No    Family History  Problem Relation Age of Onset   Healthy Mother    Cancer Neg Hx    Diabetes Neg Hx    Hypertension Neg Hx    Stroke Neg Hx       Review of Systems  Constitutional: negative for fatigue and weight loss Respiratory: negative for cough and wheezing Cardiovascular: negative for chest pain, fatigue and palpitations Gastrointestinal: negative for abdominal pain and change in bowel habits Musculoskeletal:negative for myalgias Neurological: negative for gait problems and tremors Behavioral/Psych: negative for abusive relationship, depression Endocrine: negative for temperature intolerance    Genitourinary:negative for abnormal menstrual periods, genital lesions, hot flashes, sexual problems and vaginal discharge Integument/breast: negative for breast lump, breast tenderness, nipple discharge and skin lesion(s)  Objective:       BP 124/78   Pulse 60   Ht 5\' 8"  (1.727 m)   Wt 293 lb (132.9 kg)   LMP 12/05/2020 (Exact Date)   BMI 44.55 kg/m  General:   Alert and no distress  Skin:   no rash or abnormalities  Lungs:   clear to auscultation bilaterally  Heart:   regular rate and rhythm, S1, S2 normal, no murmur, click, rub or gallop  Breasts:   normal without suspicious masses, skin or nipple changes or axillary nodes  Abdomen:  normal findings: no organomegaly, soft, non-tender and no hernia  Pelvis:  External  genitalia: normal general appearance Urinary system: urethral meatus normal and bladder without fullness, nontender Vaginal: normal without tenderness, induration or masses Cervix: normal appearance Adnexa: normal bimanual exam Uterus: anteverted and non-tender, normal size   Lab Review Urine pregnancy test Labs reviewed yes Radiologic studies reviewed no  I have spent a total of 20 minutes of face-to-face time, excluding clinical staff time, reviewing notes and preparing to see patient, ordering tests and/or medications, and counseling the patient.   Assessment:    1. Encounter for routine gynecological examination with Papanicolaou smear of cervix - doing well  2. Class 3 severe obesity due to excess calories without serious comorbidity with body mass index (BMI) of 40.0 to 44.9 in adult [facility]) - currently participating in weight loss management with PCP  3. Superficial fungus infection of skin Rx: - clotrimazole-betamethasone (LOTRISONE) cream; Apply 1 application topically 2 (two) times daily.  Dispense: 45 g; Refill: 1  4. Encounter for surveillance of implantable subdermal contraceptive - pleased with explanon     Plan:    Education reviewed: calcium supplements, depression evaluation, low fat, low cholesterol diet, safe sex/STD prevention, self breast exams, and weight bearing exercise. Contraception: Nexplanon. Follow up in: 1 year.     Shelly Bombard, MD 12/24/2020 9:22 AM

## 2020-12-24 NOTE — Addendum Note (Signed)
Addended by: Courtney Heys on: 12/24/2020 11:59 AM   Modules accepted: Orders

## 2020-12-26 LAB — CYTOLOGY - PAP
Comment: NEGATIVE
Diagnosis: NEGATIVE
High risk HPV: NEGATIVE

## 2020-12-28 ENCOUNTER — Ambulatory Visit (HOSPITAL_COMMUNITY)
Admission: EM | Admit: 2020-12-28 | Discharge: 2020-12-28 | Disposition: A | Discharge status: Home / Self Care | Payer: 59

## 2020-12-28 NOTE — ED Triage Notes (Signed)
Pt is present for a suture removal on her right middle finger. Denies any pain

## 2021-03-25 ENCOUNTER — Telehealth: Payer: 59 | Admitting: Physician Assistant

## 2021-03-25 DIAGNOSIS — A084 Viral intestinal infection, unspecified: Secondary | ICD-10-CM

## 2021-03-25 MED ORDER — ONDANSETRON 4 MG PO TBDP: 4.0000 mg | ORAL_TABLET | Freq: Three times a day (TID) | ORAL | 0 refills | Status: DC | PRN

## 2021-03-25 NOTE — Patient Instructions (Signed)
Maureen Griffin, thank you for joining Leeanne Rio, PA-C for today's virtual visit.  While this provider is not your primary care provider (PCP), if your PCP is located in our provider database this encounter information will be shared with them immediately following your visit.  Consent: (Patient) Maureen Griffin provided verbal consent for this virtual visit at the beginning of the encounter.  Current Medications:  Current Outpatient Medications:    clotrimazole-betamethasone (LOTRISONE) cream, Apply 1 application topically 2 (two) times daily., Disp: 45 g, Rfl: 1   phentermine 37.5 MG capsule, Take 1 capsule (37.5 mg total) by mouth every morning., Disp: 30 capsule, Rfl: 0   phentermine 37.5 MG capsule, Take 1 capsule (37.5 mg total) by mouth every morning., Disp: 30 capsule, Rfl: 0   phentermine 37.5 MG capsule, Take 1 capsule (37.5 mg total) by mouth every morning., Disp: 30 capsule, Rfl: 0   Prenatal Vit-Fe Fumarate-FA (PRENATAL MULTIVITAMIN) TABS tablet, Take 1 tablet by mouth daily at 12 noon., Disp: , Rfl:   Current Facility-Administered Medications:    etonogestrel (NEXPLANON) implant 68 mg, 68 mg, Subdermal, Once, Woodroe Mode, MD   Medications ordered in this encounter:  No orders of the defined types were placed in this encounter.    *If you need refills on other medications prior to your next appointment, please contact your pharmacy*  Follow-Up: Call back or seek an in-person evaluation if the symptoms worsen or if the condition fails to improve as anticipated.  Other Instructions Please keep well-hydrated. Start dietary recommendations below to work back up to your regular diet. Use the Zofran as directed if needed for nausea.  Again if you become unable to keep fluids in, you need to be evaluated in person at nearest Urgent Care or ER if severe.   Bland Diet A bland diet consists of foods that are often soft and do not have a lot of fat, fiber, or extra  seasonings. Foods without fat, fiber, or seasoning are easier for the body to digest. They are also less likely to irritate your mouth, throat, stomach, and other parts of your digestive system. A bland diet is sometimes called a BRAT diet. What is my plan? Your health care provider or food and nutrition specialist (dietitian) may recommend specific changes to your diet to prevent symptoms or to treat your symptoms. These changes may include: Eating small meals often. Cooking food until it is soft enough to chew easily. Chewing your food well. Drinking fluids slowly. Not eating foods that are very spicy, sour, or fatty. Not eating citrus fruits, such as oranges and grapefruit. What do I need to know about this diet? Eat a variety of foods from the bland diet food list. Do not follow a bland diet longer than needed. Ask your health care provider whether you should take vitamins or supplements. What foods can I eat? Grains Hot cereals, such as cream of wheat. Rice. Bread, crackers, or tortillas made from refined white flour. Vegetables Canned or cooked vegetables. Mashed or boiled potatoes. Fruits Bananas. Applesauce. Other types of cooked or canned fruit with the skin and seeds removed, such as canned peaches or pears. Meats and other proteins Scrambled eggs. Creamy peanut butter or other nut butters. Lean, well-cooked meats, such as chicken or fish. Tofu. Soups or broths. Dairy Low-fat dairy products, such as milk, cottage cheese, or yogurt. Beverages Water. Herbal tea. Apple juice. Fats and oils Mild salad dressings. Canola or olive oil. Sweets and desserts Pudding.  Custard. Fruit gelatin. Ice cream. The items listed above may not be a complete list of recommended foods and beverages. Contact a dietitian for more options. What foods are not recommended? Grains Whole grain breads and cereals. Vegetables Raw vegetables. Fruits Raw fruits, especially citrus, berries, or dried  fruits. Dairy Whole fat dairy foods. Beverages Caffeinated drinks. Alcohol. Seasonings and condiments Strongly flavored seasonings or condiments. Hot sauce. Salsa. Other foods Spicy foods. Fried foods. Sour foods, such as pickled or fermented foods. Foods with high sugar content. Foods high in fiber. The items listed above may not be a complete list of foods and beverages to avoid. Contact a dietitian for more information. Summary A bland diet consists of foods that are often soft and do not have a lot of fat, fiber, or extra seasonings. Foods without fat, fiber, or seasoning are easier for the body to digest. Check with your health care provider to see how long you should follow this diet plan. It is not meant to be followed for long periods. This information is not intended to replace advice given to you by your health care provider. Make sure you discuss any questions you have with your health care provider. Document Revised: 07/21/2017 Document Reviewed: 07/21/2017 Elsevier Patient Education  2022 [employer].    If you have been instructed to have an in-person evaluation today at a local Urgent Care facility, please use the link below. It will take you to a list of all of our available [facility], including address, phone number and hours of operation. Please do not delay care.  [facility]  If you or a family member do not have a primary care provider, use the link below to schedule a visit and establish care. When you choose a [facility] primary care physician or advanced practice provider, you gain a long-term partner in health. Find a Primary Care Provider  Learn more about [facility] in-office and virtual care options: [facility]

## 2021-03-25 NOTE — Progress Notes (Signed)
Virtual Visit Consent   Maureen Griffin, you are scheduled for a virtual visit with a [facility] provider today.     Just as with appointments in the office, your consent must be obtained to participate.  Your consent will be active for this visit and any virtual visit you may have with one of our providers in the next 365 days.     If you have a MyChart account, a copy of this consent can be sent to you electronically.  All virtual visits are billed to your insurance company just like a traditional visit in the office.    As this is a virtual visit, video technology does not allow for your provider to perform a traditional examination.  This may limit your provider's ability to fully assess your condition.  If your provider identifies any concerns that need to be evaluated in person or the need to arrange testing (such as labs, EKG, etc.), we will make arrangements to do so.     Although advances in technology are sophisticated, we cannot ensure that it will always work on either your end or our end.  If the connection with a video visit is poor, the visit may have to be switched to a telephone visit.  With either a video or telephone visit, we are not always able to ensure that we have a secure connection.     I need to obtain your verbal consent now.   Are you willing to proceed with your visit today?    Maureen Griffin has provided verbal consent on 03/25/2021 for a virtual visit (video or telephone).   Maureen Griffin, [place]   Date: 03/25/2021 10:12 AM  Virtual Visit via Video Note   I, Maureen Griffin, connected with  Maureen Griffin  ([id number], 15-Oct-1985) on 03/25/21 at 10:00 AM EDT by a video-enabled telemedicine application and verified that I am speaking with the correct person using two identifiers.  Location: Patient: Virtual Visit Location Patient: Home Provider: Virtual Visit Location Provider: Home Office   I discussed the limitations of evaluation and management by  telemedicine and the availability of in person appointments. The patient expressed understanding and agreed to proceed.    History of Present Illness: Maureen Griffin is a 35 y.o. who identifies as a female who was assigned female at birth, and is being seen today for possible stomach bug. Notes both of her daughters with similar symptoms, being evaluated with their Pediatrician and diagnosed with a viral gastroenteritis. They are feeling better but she notes her symptoms starting yesterday with nausea and abdominal cramping. Also with non-bloody emesis x 2-3. None since 7 PM last night. Yesterday with diarrhea. Diarrhea, cramping have also resolved but she continues to have nausea and anorexia. Is keeping hydrated.   HPI: HPI  Problems:  Patient Active Problem List   Diagnosis Date Noted   GERD (gastroesophageal reflux disease) 08/04/2018   Morbid obesity ([place]) 04/24/2016    Allergies: No Known Allergies Medications:  Current Outpatient Medications:    ondansetron (ZOFRAN ODT) 4 MG disintegrating tablet, Take 1 tablet (4 mg total) by mouth every 8 (eight) hours as needed for nausea or vomiting., Disp: 20 tablet, Rfl: 0   clotrimazole-betamethasone (LOTRISONE) cream, Apply 1 application topically 2 (two) times daily., Disp: 45 g, Rfl: 1   phentermine 37.5 MG capsule, Take 1 capsule (37.5 mg total) by mouth every morning., Disp: 30 capsule, Rfl: 0   phentermine 37.5 MG capsule, Take 1  capsule (37.5 mg total) by mouth every morning., Disp: 30 capsule, Rfl: 0   phentermine 37.5 MG capsule, Take 1 capsule (37.5 mg total) by mouth every morning., Disp: 30 capsule, Rfl: 0   Prenatal Vit-Fe Fumarate-FA (PRENATAL MULTIVITAMIN) TABS tablet, Take 1 tablet by mouth daily at 12 noon., Disp: , Rfl:   Current Facility-Administered Medications:    etonogestrel (NEXPLANON) implant 68 mg, 68 mg, Subdermal, Once, Woodroe Mode, MD  Observations/Objective: Patient is well-developed, well-nourished in no  acute distress.  Resting comfortably at home.  Head is normocephalic, atraumatic.  No labored breathing. Speech is clear and coherent with logical content.  Patient is alert and oriented at baseline.   Assessment and Plan: 1. Viral gastroenteritis - ondansetron (ZOFRAN ODT) 4 MG disintegrating tablet; Take 1 tablet (4 mg total) by mouth every 8 (eight) hours as needed for nausea or vomiting.  Dispense: 20 tablet; Refill: 0 Mild. No alarm signs/symptoms. Symptoms already improving/resolving. Will start her on PO fluids, BRAT diet. Rx Zofran ODT as needed for nausea. Symptoms should continue to improve until complete resolution. UC/ER precautions reviewed with patient.   Follow Up Instructions: I discussed the assessment and treatment plan with the patient. The patient was provided an opportunity to ask questions and all were answered. The patient agreed with the plan and demonstrated an understanding of the instructions.  A copy of instructions were sent to the patient via MyChart.  The patient was advised to call back or seek an in-person evaluation if the symptoms worsen or if the condition fails to improve as anticipated.  Time:  I spent 10 minutes with the patient via telehealth technology discussing the above problems/concerns.    Maureen Rio, PA-C

## 2021-06-03 ENCOUNTER — Encounter: Payer: 59 | Admitting: Family Medicine

## 2021-06-16 ENCOUNTER — Encounter: Payer: 59 | Admitting: Family Medicine

## 2021-06-18 ENCOUNTER — Ambulatory Visit (INDEPENDENT_AMBULATORY_CARE_PROVIDER_SITE_OTHER): Payer: 59 | Admitting: Family Medicine

## 2021-06-18 ENCOUNTER — Encounter: Payer: Self-pay | Admitting: Family Medicine

## 2021-06-18 VITALS — BP 120/72 | HR 80 | Temp 98.7°F | Ht 68.0 in | Wt 289.5 lb

## 2021-06-18 DIAGNOSIS — Z0000 Encounter for general adult medical examination without abnormal findings: Secondary | ICD-10-CM

## 2021-06-18 LAB — COMPREHENSIVE METABOLIC PANEL
ALT: 15 U/L (ref 0–35)
AST: 14 U/L (ref 0–37)
Albumin: 3.9 g/dL (ref 3.5–5.2)
Alkaline Phosphatase: 62 U/L (ref 39–117)
BUN: 11 mg/dL (ref 6–23)
CO2: 28 mEq/L (ref 19–32)
Calcium: 9.2 mg/dL (ref 8.4–10.5)
Chloride: 104 mEq/L (ref 96–112)
Creatinine, Ser: 0.72 mg/dL (ref 0.40–1.20)
GFR: 108.69 mL/min (ref >60.00)
Glucose, Bld: 71 mg/dL (ref 70–99)
Potassium: 4.2 mEq/L (ref 3.5–5.1)
Sodium: 137 mEq/L (ref 135–145)
Total Bilirubin: 0.4 mg/dL (ref 0.2–1.2)
Total Protein: 7 g/dL (ref 6.0–8.3)

## 2021-06-18 LAB — CBC
HCT: 39 % (ref 36.0–46.0)
Hemoglobin: 12.9 g/dL (ref 12.0–15.0)
MCHC: 33.2 g/dL (ref 30.0–36.0)
MCV: 81.9 fl (ref 78.0–100.0)
Platelets: 255 10*3/uL (ref 150.0–400.0)
RBC: 4.76 Mil/uL (ref 3.87–5.11)
RDW: 14 % (ref 11.5–15.5)
WBC: 7 10*3/uL (ref 4.0–10.5)

## 2021-06-18 LAB — LIPID PANEL
Cholesterol: 144 mg/dL (ref 0–200)
HDL: 50.5 mg/dL (ref >39.00)
LDL Cholesterol: 74 mg/dL (ref 0–99)
NonHDL: 93.26
Total CHOL/HDL Ratio: 3
Triglycerides: 96 mg/dL (ref 0.0–149.0)
VLDL: 19.2 mg/dL (ref 0.0–40.0)

## 2021-06-18 NOTE — Progress Notes (Signed)
Chief Complaint  Patient presents with   Annual Exam     Well Woman Maureen Griffin is here for a complete physical.   Her last physical was >1 year ago.  Current diet: in general, a "healthy" diet. overall Current exercise: cardio, wt lifting Fatigue out of ordinary? No Seatbelt? Yes Advanced directive? No  Health Maintenance Pap/HPV- Yes Tetanus- Yes HIV screening- Yes Hep C screening- Yes  Past Medical History:  Diagnosis Date   Fibroid    Gestational diabetes    Hypertension    Morbid obesity ([place]) 04/24/2016   Pregnancy induced hypertension      Past Surgical History:  Procedure Laterality Date   NO PAST SURGERIES      Medications  Current Outpatient Medications on File Prior to Visit  Medication Sig Dispense Refill   clotrimazole-betamethasone (LOTRISONE) cream Apply 1 application topically 2 (two) times daily. 45 g 1   ondansetron (ZOFRAN ODT) 4 MG disintegrating tablet Take 1 tablet (4 mg total) by mouth every 8 (eight) hours as needed for nausea or vomiting. 20 tablet 0   phentermine 37.5 MG capsule Take 1 capsule (37.5 mg total) by mouth every morning. 30 capsule 0   phentermine 37.5 MG capsule Take 1 capsule (37.5 mg total) by mouth every morning. 30 capsule 0   phentermine 37.5 MG capsule Take 1 capsule (37.5 mg total) by mouth every morning. 30 capsule 0   Prenatal Vit-Fe Fumarate-FA (PRENATAL MULTIVITAMIN) TABS tablet Take 1 tablet by mouth daily at 12 noon.     Current Facility-Administered Medications on File Prior to Visit  Medication Dose Route Frequency Provider Last Rate Last Admin   etonogestrel (NEXPLANON) implant 68 mg  68 mg Subdermal Once Woodroe Mode, MD         Allergies No Known Allergies  Review of Systems: Constitutional:  no unexpected weight changes Eye:  no recent significant change in vision Ear/Nose/Mouth/Throat:  Ears:  no tinnitus or vertigo and no recent change in hearing Nose/Mouth/Throat:  no complaints of nasal  congestion, no sore throat Cardiovascular: no chest pain Respiratory:  no cough and no shortness of breath Gastrointestinal:  no abdominal pain, no change in bowel habits GU:  Female: negative for dysuria or pelvic pain Musculoskeletal/Extremities: +L low back pain; otherwise no pain of the joints Integumentary (Skin/Breast):  no abnormal skin lesions reported Neurologic:  no headaches Endocrine:  denies fatigue Hematologic/Lymphatic:  No areas of easy bleeding  Exam BP 120/72    Pulse 80    Temp 98.7 F (37.1 C) (Oral)    Ht 5\' 8"  (1.727 m)    Wt 289 lb 8 oz (131.3 kg)    SpO2 99%    BMI 44.02 kg/m  General:  well developed, well nourished, in no apparent distress Skin:  no significant moles, warts, or growths Head:  no masses, lesions, or tenderness Eyes:  pupils equal and round, sclera anicteric without injection Ears:  canals without lesions, TMs shiny without retraction, no obvious effusion, no erythema Nose:  nares patent, septum midline, mucosa normal, and no drainage or sinus tenderness Throat/Pharynx:  lips and gingiva without lesion; tongue and uvula midline; non-inflamed pharynx; no exudates or postnasal drainage Neck: neck supple without adenopathy, thyromegaly, or masses Lungs:  clear to auscultation, breath sounds equal bilaterally, no respiratory distress Cardio:  regular rate and rhythm, no bruits, no LE edema Abdomen:  abdomen soft, nontender; bowel sounds normal; no masses or organomegaly Genital: Defer to GYN Musculoskeletal: No ttp over  lumbar region, poor flexibility of hamstrings; symmetrical muscle groups noted without atrophy or deformity Extremities:  no clubbing, cyanosis, or edema, no deformities, no skin discoloration Neuro:  gait normal; deep tendon reflexes normal and symmetric Psych: well oriented with normal range of affect and appropriate judgment/insight  Assessment and Plan  Well adult exam - Plan: CBC, Comprehensive metabolic panel, Lipid panel    Well 35 y.o. female. Counseled on diet and exercise. Other orders as above. Advanced directive form provided. Bivalent covid booster rec'd.  Follow up in 1 yr. The patient voiced understanding and agreement to the plan.  [place], DO 06/18/21 2:52 PM

## 2021-06-18 NOTE — Patient Instructions (Addendum)
Give [place] 2-3 business days to get the results of your labs back.   Keep the diet clean and stay active.  I recommend getting the updated bivalent covid vaccination booster at your convenience.   Let us know if you need anything.  EXERCISES  RANGE OF MOTION (ROM) AND STRETCHING EXERCISES - Low Back Pain Most people with lower back pain will find that their symptoms get worse with excessive bending forward (flexion) or arching at the lower back (extension). The exercises that will help resolve your symptoms will focus on the opposite motion.  If you have pain, numbness or tingling which travels down into your buttocks, leg or foot, the goal of the therapy is for these symptoms to move closer to your back and eventually resolve. Sometimes, these leg symptoms will get better, but your lower back pain may worsen. This is often an indication of progress in your rehabilitation. Be very alert to any changes in your symptoms and the activities in which you participated in the 24 hours prior to the change. Sharing this information with your caregiver will allow him or her to most efficiently treat your condition. These exercises may help you when beginning to rehabilitate your injury. Your symptoms may resolve with or without further involvement from your physician, physical therapist or athletic trainer. While completing these exercises, remember:  Restoring tissue flexibility helps normal motion to return to the joints. This allows healthier, less painful movement and activity. An effective stretch should be held for at least 30 seconds. A stretch should never be painful. You should only feel a gentle lengthening or release in the stretched tissue. FLEXION RANGE OF MOTION AND STRETCHING EXERCISES:  STRETCH - Flexion, Single Knee to Chest  Lie on a firm bed or floor with both legs extended in front of you. Keeping one leg in contact with the floor, bring your opposite knee to your chest. Hold your leg in  place by either grabbing behind your thigh or at your knee. Pull until you feel a gentle stretch in your low back. Hold 30 seconds. Slowly release your grasp and repeat the exercise with the opposite side. Repeat 2 times. Complete this exercise 3 times per week.   STRETCH - Flexion, Double Knee to Chest Lie on a firm bed or floor with both legs extended in front of you. Keeping one leg in contact with the floor, bring your opposite knee to your chest. Tense your stomach muscles to support your back and then lift your other knee to your chest. Hold your legs in place by either grabbing behind your thighs or at your knees. Pull both knees toward your chest until you feel a gentle stretch in your low back. Hold 30 seconds. Tense your stomach muscles and slowly return one leg at a time to the floor. Repeat 2 times. Complete this exercise 3 times per week.   STRETCH - Low Trunk Rotation Lie on a firm bed or floor. Keeping your legs in front of you, bend your knees so they are both pointed toward the ceiling and your feet are flat on the floor. Extend your arms out to the side. This will stabilize your upper body by keeping your shoulders in contact with the floor. Gently and slowly drop both knees together to one side until you feel a gentle stretch in your low back. Hold for 30 seconds. Tense your stomach muscles to support your lower back as you bring your knees back to the starting position. Repeat the  exercise to the other side. Repeat 2 times. Complete this exercise at least 3 times per week.   EXTENSION RANGE OF MOTION AND FLEXIBILITY EXERCISES:  STRETCH - Extension, Prone on Elbows  Lie on your stomach on the floor, a bed will be too soft. Place your palms about shoulder width apart and at the height of your head. Place your elbows under your shoulders. If this is too painful, stack pillows under your chest. Allow your body to relax so that your hips drop lower and make contact more  completely with the floor. Hold this position for 30 seconds. Slowly return to lying flat on the floor. Repeat 2 times. Complete this exercise 3 times per week.   RANGE OF MOTION - Extension, Prone Press Ups Lie on your stomach on the floor, a bed will be too soft. Place your palms about shoulder width apart and at the height of your head. Keeping your back as relaxed as possible, slowly straighten your elbows while keeping your hips on the floor. You may adjust the placement of your hands to maximize your comfort. As you gain motion, your hands will come more underneath your shoulders. Hold this position 30 seconds. Slowly return to lying flat on the floor. Repeat 2 times. Complete this exercise 3 times per week.   RANGE OF MOTION- Quadruped, Neutral Spine  Assume a hands and knees position on a firm surface. Keep your hands under your shoulders and your knees under your hips. You may place padding under your knees for comfort. Drop your head and point your tailbone toward the ground below you. This will round out your lower back like an angry cat. Hold this position for 30 seconds. Slowly lift your head and release your tail bone so that your back sags into a large arch, like an old horse. Hold this position for 30 seconds. Repeat this until you feel limber in your low back. Now, find your "sweet spot." This will be the most comfortable position somewhere between the two previous positions. This is your neutral spine. Once you have found this position, tense your stomach muscles to support your low back. Hold this position for 30 seconds. Repeat 2 times. Complete this exercise 3 times per week.   STRENGTHENING EXERCISES - Low Back Sprain These exercises may help you when beginning to rehabilitate your injury. These exercises should be done near your "sweet spot." This is the neutral, low-back arch, somewhere between fully rounded and fully arched, that is your least painful position. When  performed in this safe range of motion, these exercises can be used for people who have either a flexion or extension based injury. These exercises may resolve your symptoms with or without further involvement from your physician, physical therapist or athletic trainer. While completing these exercises, remember:  Muscles can gain both the endurance and the strength needed for everyday activities through controlled exercises. Complete these exercises as instructed by your physician, physical therapist or athletic trainer. Increase the resistance and repetitions only as guided. You may experience muscle soreness or fatigue, but the pain or discomfort you are trying to eliminate should never worsen during these exercises. If this pain does worsen, stop and make certain you are following the directions exactly. If the pain is still present after adjustments, discontinue the exercise until you can discuss the trouble with your caregiver.  STRENGTHENING - Deep Abdominals, Pelvic Tilt  Lie on a firm bed or floor. Keeping your legs in front of you, bend your  knees so they are both pointed toward the ceiling and your feet are flat on the floor. Tense your lower abdominal muscles to press your low back into the floor. This motion will rotate your pelvis so that your tail bone is scooping upwards rather than pointing at your feet or into the floor. With a gentle tension and even breathing, hold this position for 3 seconds. Repeat 2 times. Complete this exercise 3 times per week.   STRENGTHENING - Abdominals, Crunches  Lie on a firm bed or floor. Keeping your legs in front of you, bend your knees so they are both pointed toward the ceiling and your feet are flat on the floor. Cross your arms over your chest. Slightly tip your chin down without bending your neck. Tense your abdominals and slowly lift your trunk high enough to just clear your shoulder blades. Lifting higher can put excessive stress on the lower  back and does not further strengthen your abdominal muscles. Control your return to the starting position. Repeat 2 times. Complete this exercise 3 times per week.   STRENGTHENING - Quadruped, Opposite UE/LE Lift  Assume a hands and knees position on a firm surface. Keep your hands under your shoulders and your knees under your hips. You may place padding under your knees for comfort. Find your neutral spine and gently tense your abdominal muscles so that you can maintain this position. Your shoulders and hips should form a rectangle that is parallel with the floor and is not twisted. Keeping your trunk steady, lift your right hand no higher than your shoulder and then your left leg no higher than your hip. Make sure you are not holding your breath. Hold this position for 30 seconds. Continuing to keep your abdominal muscles tense and your back steady, slowly return to your starting position. Repeat with the opposite arm and leg. Repeat 2 times. Complete this exercise 3 times per week.   STRENGTHENING - Abdominals and Quadriceps, Straight Leg Raise  Lie on a firm bed or floor with both legs extended in front of you. Keeping one leg in contact with the floor, bend the other knee so that your foot can rest flat on the floor. Find your neutral spine, and tense your abdominal muscles to maintain your spinal position throughout the exercise. Slowly lift your straight leg off the floor about 6 inches for a count of 3, making sure to not hold your breath. Still keeping your neutral spine, slowly lower your leg all the way to the floor. Repeat this exercise with each leg 2 times. Complete this exercise 3 times per week.  POSTURE AND BODY MECHANICS CONSIDERATIONS - Low Back Sprain Keeping correct posture when sitting, standing or completing your activities will reduce the stress put on different body tissues, allowing injured tissues a chance to heal and limiting painful experiences. The following are  general guidelines for improved posture.  While reading these guidelines, remember: The exercises prescribed by your provider will help you have the flexibility and strength to maintain correct postures. The correct posture provides the best environment for your joints to work. All of your joints have less wear and tear when properly supported by a spine with good posture. This means you will experience a healthier, less painful body. Correct posture must be practiced with all of your activities, especially prolonged sitting and standing. Correct posture is as important when doing repetitive low-stress activities (typing) as it is when doing a single heavy-load activity (lifting).  RESTING POSITIONS  Consider which positions are most painful for you when choosing a resting position. If you have pain with flexion-based activities (sitting, bending, stooping, squatting), choose a position that allows you to rest in a less flexed posture. You would want to avoid curling into a fetal position on your side. If your pain worsens with extension-based activities (prolonged standing, working overhead), avoid resting in an extended position such as sleeping on your stomach. Most people will find more comfort when they rest with their spine in a more neutral position, neither too rounded nor too arched. Lying on a non-sagging bed on your side with a pillow between your knees, or on your back with a pillow under your knees will often provide some relief. Keep in mind, being in any one position for a prolonged period of time, no matter how correct your posture, can still lead to stiffness.  PROPER SITTING POSTURE In order to minimize stress and discomfort on your spine, you must sit with correct posture. Sitting with good posture should be effortless for a healthy body. Returning to good posture is a gradual process. Many people can work toward this most comfortably by using various supports until they have the  flexibility and strength to maintain this posture on their own. When sitting with proper posture, your ears will fall over your shoulders and your shoulders will fall over your hips. You should use the back of the chair to support your upper back. Your lower back will be in a neutral position, just slightly arched. You may place a small pillow or folded towel at the base of your lower back for  support.  When working at a desk, create an environment that supports good, upright posture. Without extra support, muscles tire, which leads to excessive strain on joints and other tissues. Keep these recommendations in mind:  CHAIR: A chair should be able to slide under your desk when your back makes contact with the back of the chair. This allows you to work closely. The chair's height should allow your eyes to be level with the upper part of your monitor and your hands to be slightly lower than your elbows.  BODY POSITION Your feet should make contact with the floor. If this is not possible, use a foot rest. Keep your ears over your shoulders. This will reduce stress on your neck and low back.  INCORRECT SITTING POSTURES  If you are feeling tired and unable to assume a healthy sitting posture, do not slouch or slump. This puts excessive strain on your back tissues, causing more damage and pain. Healthier options include: Using more support, like a lumbar pillow. Switching tasks to something that requires you to be upright or walking. Talking a brief walk. Lying down to rest in a neutral-spine position.  PROLONGED STANDING WHILE SLIGHTLY LEANING FORWARD  When completing a task that requires you to lean forward while standing in one place for a long time, place either foot up on a stationary 2-4 inch high object to help maintain the best posture. When both feet are on the ground, the lower back tends to lose its slight inward curve. If this curve flattens (or becomes too large), then the back and your  other joints will experience too much stress, tire more quickly, and can cause pain.  CORRECT STANDING POSTURES Proper standing posture should be assumed with all daily activities, even if they only take a few moments, like when brushing your teeth. As in sitting, your ears should fall over  your shoulders and your shoulders should fall over your hips. You should keep a slight tension in your abdominal muscles to brace your spine. Your tailbone should point down to the ground, not behind your body, resulting in an over-extended swayback posture.   INCORRECT STANDING POSTURES  Common incorrect standing postures include a forward head, locked knees and/or an excessive swayback. WALKING Walk with an upright posture. Your ears, shoulders and hips should all line-up.  PROLONGED ACTIVITY IN A FLEXED POSITION When completing a task that requires you to bend forward at your waist or lean over a low surface, try to find a way to stabilize 3 out of 4 of your limbs. You can place a hand or elbow on your thigh or rest a knee on the surface you are reaching across. This will provide you more stability, so that your muscles do not tire as quickly. By keeping your knees relaxed, or slightly bent, you will also reduce stress across your lower back. CORRECT LIFTING TECHNIQUES  DO : Assume a wide stance. This will provide you more stability and the opportunity to get as close as possible to the object which you are lifting. Tense your abdominals to brace your spine. Bend at the knees and hips. Keeping your back locked in a neutral-spine position, lift using your leg muscles. Lift with your legs, keeping your back straight. Test the weight of unknown objects before attempting to lift them. Try to keep your elbows locked down at your sides in order get the best strength from your shoulders when carrying an object.   Always ask for help when lifting heavy or awkward objects. INCORRECT LIFTING TECHNIQUES DO NOT:   Lock your knees when lifting, even if it is a small object. Bend and twist. Pivot at your feet or move your feet when needing to change directions. Assume that you can safely pick up even a paperclip without proper posture.

## 2021-08-21 ENCOUNTER — Encounter: Payer: Self-pay | Admitting: Obstetrics

## 2021-08-21 ENCOUNTER — Ambulatory Visit (INDEPENDENT_AMBULATORY_CARE_PROVIDER_SITE_OTHER): Payer: 59 | Admitting: Obstetrics

## 2021-08-21 ENCOUNTER — Other Ambulatory Visit: Payer: Self-pay

## 2021-08-21 VITALS — BP 104/71 | HR 64 | Ht 68.0 in | Wt 290.6 lb

## 2021-08-21 DIAGNOSIS — Z3046 Encounter for surveillance of implantable subdermal contraceptive: Secondary | ICD-10-CM

## 2021-08-21 NOTE — Progress Notes (Signed)
Nexplanon placed June 2020. Reports increased depression, weight gain, bleeding times 2 weeks with cycle, increased pain with cycle.

## 2021-08-21 NOTE — Progress Notes (Signed)
[facility] REMOVAL NOTE  Date of LMP:   unknown  Contraception used: *Nexplanon   Indications:  The patient desires removal of Nexplanon because of AUB.  She understands risks, benefits, and alternatives to Implanon and would like to proceed.  Anesthesia:   Lidocaine 1% plain.  Procedure:  A time-out was performed confirming the procedure and the patient's allergy status.  Complications: None                      The rod was palpated and the area was sterilely prepped.  The area beneath the distal tip was anesthetized with 1% xylocaine and the skin incised                       Over the tip and the tip was exposed, grasped with forcep and removed intact.  A single suture of 4-0 Vicryl was used to close incision.  Steri strip                       And a bandage applied and the arm was wrapped with gauze bandage.  The patient tolerated well.  Instructions:  The patient was instructed to remove the dressing in 24 hours and that some bruising is to be expected.  She was advised to use over the counter analgesics as needed for any pain at the site.  She is to keep the area dry for 24 hours and to call if her hand or arm becomes cold, numb, or blue.  Return visit:  Return in 2 weeks   Shelly Bombard, MD 08/21/2021 8:42 AM

## 2021-08-25 ENCOUNTER — Telehealth: Payer: Self-pay

## 2021-08-25 NOTE — Telephone Encounter (Signed)
Returned call, pt stated that provider was suppose to send [facility] pills to pharmacy but she has not received them, routed to provider for review.

## 2021-08-26 ENCOUNTER — Other Ambulatory Visit: Payer: Self-pay | Admitting: Obstetrics

## 2021-08-26 DIAGNOSIS — Z30011 Encounter for initial prescription of contraceptive pills: Secondary | ICD-10-CM

## 2021-08-26 MED ORDER — ORTHO-NOVUM 1/35 (28) 1-35 MG-MCG PO TABS
1.0000 | ORAL_TABLET | Freq: Every day | ORAL | 11 refills | Status: AC
Start: 2021-08-26 — End: ?

## 2021-09-04 ENCOUNTER — Ambulatory Visit: Payer: 59 | Admitting: Obstetrics

## 2021-10-08 ENCOUNTER — Encounter: Payer: Self-pay | Admitting: Family Medicine

## 2021-10-08 ENCOUNTER — Ambulatory Visit (INDEPENDENT_AMBULATORY_CARE_PROVIDER_SITE_OTHER): Payer: 59 | Admitting: Family Medicine

## 2021-10-08 VITALS — BP 106/64 | HR 60 | Ht 68.0 in | Wt 291.2 lb

## 2021-10-08 DIAGNOSIS — R102 Pelvic and perineal pain: Secondary | ICD-10-CM

## 2021-10-08 DIAGNOSIS — D259 Leiomyoma of uterus, unspecified: Secondary | ICD-10-CM

## 2021-10-08 NOTE — Progress Notes (Signed)
? ?GYNECOLOGY OFFICE VISIT NOTE ? ?History:  ? Maureen Griffin is a 36 y.o. [id number] here today for evaluation of suspected fibroid pain.  ? ?Reports L sided pelvic "tightening and pressure" for the past several months that has worsened in severity. Worse when she is active, standing for long periods of time, and with her menstrual cycle. Sitting for long periods seems to relieve the pain. She has mentioned this to her PCP and started back/hip stretches which help, but feels like the pain is "more inside." She is worried it is her fibroids. Urinating as normal. Daily soft Bms. Eating and drinking without difficulty. No injury or trauma.  ? ?Her cycles are "normal" and not too heavy per her report with [facility].  ? ? ?Fibroids from previous [facility]FM Ob ultrasound in 2018:  ?Fundus Left              4.4        4.1        3.1cm ?Fundus Left              5.4        7.2        6.5cm ?Posterior                   3.6        3.4        2.4cm  ? ?  ?Past Medical History:  ?Diagnosis Date  ? Fibroid   ? Gestational diabetes   ? Hypertension   ? Morbid obesity ([place]) 04/24/2016  ? Pregnancy induced hypertension   ? ? ?Past Surgical History:  ?Procedure Laterality Date  ? NO PAST SURGERIES    ? ? ?The following portions of the patient's history were reviewed and updated as appropriate: allergies, current medications, past family history, past medical history, past social history, past surgical history and problem list.  ? ?Health Maintenance:  Normal pap on 12/2020.  ? ?Review of Systems:  ?Pertinent items noted in HPI and remainder of comprehensive ROS otherwise negative. ? ?Physical Exam:  ?BP 106/64   Pulse 60   Ht '5\' 8"'$  (1.727 m)   Wt 291 lb 3.2 oz (132.1 kg)   LMP 09/14/2021   Breastfeeding No   BMI 44.28 kg/m?  ? ?CONSTITUTIONAL: Well-developed, well-nourished female in no acute distress.  ?HEENT:  Normocephalic, atraumatic. No scleral icterus.  ?NECK: Normal range of motion, supple ?SKIN: No rash noted. Not diaphoretic. No  erythema. No pallor. ?MUSCULOSKELETAL: Normal range of motion. Non-tender to palpation of left hip flexors. Stretching of hip flexors/quads does not reproduce the same pain.  ?NEUROLOGIC: Alert and oriented to person, place, and time.  ?PSYCHIATRIC: Normal mood and affect. Normal behavior.  ?CARDIOVASCULAR: Normal heart rate noted ?RESPIRATORY: Effort normal, ?ABDOMEN: Slightly tender with deep palpation in the LLQ without masses palpated, otherwise non-tender elsewhere. No other overt distention noted.   ?Pelvic exam: Difficult to palpate due to body habitus. However uterus feels normal is size/?enlarged, tender with palpation over the left side. Adnexa normal in size. General tenderness on the left as stated above.  ? ?Labs and Imaging ?No results found for this or any previous visit (from the past 168 hour(s)). ?No results found.    ?Assessment and Plan:  ? ?1. Pelvic pain ?2. Uterine leiomyoma, unspecified location ?Difficult to discern if pelvic pain is gyn in nature with some MSK features. However possible with worsening during her cycle and no improvement with stretching over the past few  months. Will obtain US to reassess fibroid size/location (last seen during pregnancy in 2018/2020). Follow up with Ob/gyn provider to discuss next steps.  ?- US PELVIC COMPLETE WITH TRANSVAGINAL; Future  ? ?Return after ultrasound results.   ? ?Darrelyn Hillock, DO  ?OB Fellow, Faculty Practice ?[place] [facility] ?10/08/2021 3:46 PM ? ?

## 2021-10-08 NOTE — Patient Instructions (Signed)
We will get an ultrasound to look at this area and get a better idea of the fibroids in this location.  ? ?In the meantime, please continue to stretch and can use heat 20 minutes at a time several times a day to help.  ?

## 2021-10-08 NOTE — Progress Notes (Signed)
Pt is in the office for GYN visit ?LMP 09/14/21 and taking BC pills ?Last pap 12/24/20 ?Pt reports history of fibroids and having back pain radiates to pelvis 9/10 causing her to be unable to stand. Pt reports that after sitting pain decreases to 6/10. Sitting for longer periods usually relieves pain. ?

## 2021-10-15 ENCOUNTER — Ambulatory Visit
Admission: RE | Admit: 2021-10-15 | Discharge: 2021-10-15 | Disposition: A | Discharge status: Home / Self Care | Payer: 59 | Source: Ambulatory Visit | Attending: Family Medicine | Admitting: Family Medicine

## 2021-10-15 DIAGNOSIS — D259 Leiomyoma of uterus, unspecified: Secondary | ICD-10-CM | POA: Insufficient documentation

## 2021-10-15 DIAGNOSIS — D252 Subserosal leiomyoma of uterus: Secondary | ICD-10-CM | POA: Diagnosis not present

## 2021-10-15 DIAGNOSIS — D251 Intramural leiomyoma of uterus: Secondary | ICD-10-CM | POA: Diagnosis not present

## 2021-10-22 ENCOUNTER — Encounter: Payer: Self-pay | Admitting: Obstetrics

## 2021-10-22 ENCOUNTER — Ambulatory Visit (INDEPENDENT_AMBULATORY_CARE_PROVIDER_SITE_OTHER): Payer: 59 | Admitting: Obstetrics

## 2021-10-22 VITALS — BP 125/75 | HR 65 | Ht 68.0 in | Wt 292.7 lb

## 2021-10-22 DIAGNOSIS — M62830 Muscle spasm of back: Secondary | ICD-10-CM

## 2021-10-22 DIAGNOSIS — Z6841 Body Mass Index (BMI) 40.0 and over, adult: Secondary | ICD-10-CM | POA: Diagnosis not present

## 2021-10-22 DIAGNOSIS — D251 Intramural leiomyoma of uterus: Secondary | ICD-10-CM | POA: Diagnosis not present

## 2021-10-22 MED ORDER — METHOCARBAMOL 500 MG PO TABS: 500.0000 mg | ORAL_TABLET | Freq: Three times a day (TID) | ORAL | 5 refills | Status: DC | PRN

## 2021-10-22 MED ORDER — IBUPROFEN 800 MG PO TABS: 800.0000 mg | ORAL_TABLET | Freq: Three times a day (TID) | ORAL | 5 refills | Status: AC | PRN

## 2021-10-22 MED ORDER — METHOCARBAMOL 1000 MG PO TABS: 1000.0000 mg | ORAL_TABLET | Freq: Three times a day (TID) | ORAL | 5 refills | Status: AC | PRN

## 2021-10-22 MED ORDER — METHOCARBAMOL 500 MG PO TABS: 500.0000 mg | ORAL_TABLET | Freq: Three times a day (TID) | ORAL | 2 refills | Status: DC | PRN

## 2021-10-22 NOTE — Progress Notes (Signed)
Patient ID: Maureen Griffin, female   DOB: 10/05/1985, 36 y.o.   MRN: [id number] ? ? ?HPI ?Maureen Griffin is a 36 y.o. female.  Presents for ultrasound results for history of fibroids.  Also complains of back pain that radiates down her left left, daily.  Has severe pain when standing for long periods. ?HPI ? ?Past Medical History:  ?Diagnosis Date  ? Fibroid   ? Gestational diabetes   ? Hypertension   ? Morbid obesity ([place]) 04/24/2016  ? Pregnancy induced hypertension   ? ? ?Past Surgical History:  ?Procedure Laterality Date  ? NO PAST SURGERIES    ? ? ?Family History  ?Problem Relation Age of Onset  ? Healthy Mother   ? Cancer Neg Hx   ? Diabetes Neg Hx   ? Hypertension Neg Hx   ? Stroke Neg Hx   ? ? ?Social History ?Social History  ? ?Tobacco Use  ? Smoking status: Never  ? Smokeless tobacco: Never  ?Vaping Use  ? Vaping Use: Never used  ?Substance Use Topics  ? Alcohol use: No  ? Drug use: No  ? ? ?No Known Allergies ? ?Current Outpatient Medications  ?Medication Sig Dispense Refill  ? ibuprofen (ADVIL) 800 MG tablet Take 1 tablet (800 mg total) by mouth every 8 (eight) hours as needed. 30 tablet 5  ? Multiple Vitamin (MULTIVITAMIN WITH MINERALS) TABS tablet Take 1 tablet by mouth daily.    ? norethindrone-ethinyl estradiol 1/35 ([place] 1/35, 28,) tablet Take 1 tablet by mouth daily. 28 tablet 11  ? methocarbamol 1000 MG TABS Take 1,000 mg by mouth every 8 (eight) hours as needed for muscle spasms. 90 tablet 5  ? ondansetron (ZOFRAN ODT) 4 MG disintegrating tablet Take 1 tablet (4 mg total) by mouth every 8 (eight) hours as needed for nausea or vomiting. 20 tablet 0  ? phentermine 37.5 MG capsule Take 1 capsule (37.5 mg total) by mouth every morning. 30 capsule 0  ? phentermine 37.5 MG capsule Take 1 capsule (37.5 mg total) by mouth every morning. 30 capsule 0  ? phentermine 37.5 MG capsule Take 1 capsule (37.5 mg total) by mouth every morning. 30 capsule 0  ? Prenatal Vit-Fe Fumarate-FA (PRENATAL MULTIVITAMIN)  TABS tablet Take 1 tablet by mouth daily at 12 noon.    ? ?Current Facility-Administered Medications  ?Medication Dose Route Frequency Provider Last Rate Last Admin  ? etonogestrel (NEXPLANON) implant 68 mg  68 mg Subdermal Once Woodroe Mode, MD      ? ? ?Review of Systems ?Review of Systems ?Constitutional: negative for fatigue and weight loss ?Respiratory: negative for cough and wheezing ?Cardiovascular: negative for chest pain, fatigue and palpitations ?Gastrointestinal: negative for abdominal pain and change in bowel habits ?Genitourinary:negative ?Integument/breast: negative for nipple discharge ?Musculoskeletal:positive for myalgias backache ?Neurological: negative for gait problems and tremors ?Behavioral/Psych: negative for abusive relationship, depression ?Endocrine: negative for temperature intolerance    ?  ?Blood pressure 125/75, pulse 65, height '5\' 8"'$  (1.727 m), weight 292 lb 11.2 oz (132.8 kg). ? ?Physical Exam ?Physical Exam ?General:   Alert and no distress  ?Skin:   no rash or abnormalities  ?Lungs:   clear to auscultation bilaterally  ?Heart:   regular rate and rhythm, S1, S2 normal, no murmur, click, rub or gallop  ?The remainder of the physical exam deferred due to the type of encounter being consultative. ? ? ?I have spent a total of 15 minutes of face-to-face time, excluding clinical staff time,  reviewing notes and preparing to see patient, ordering tests and/or medications, and counseling the patient.  ? ?Data Reviewed ?Ultrasound ? ?Assessment  ?   ?1. Back muscle spasm ?Rx: ?- ibuprofen (ADVIL) 800 MG tablet; Take 1 tablet (800 mg total) by mouth every 8 (eight) hours as needed.  Dispense: 30 tablet; Refill: 5 ?- Ambulatory referral to Orthopedics ?- methocarbamol (ROBAXIN) 500 MG tablet; Take 1 tablet (500 mg total) by mouth every 8 (eight) hours as needed for muscle spasms.  Dispense: 30 tablet; Refill: 5 ? ?2. Intramural leiomyoma of uterus, small ?- clinically stable,  asymptomatic. ? ?3. Class 3 severe obesity due to excess calories without serious comorbidity with body mass index (BMI) of 40.0 to 44.9 in adult (HCC) ?- weight reduction with the aid of dietary changes, exercise and behavioral modification recommended   ?  ? ?Plan ?  Follow up in 2 weeks ? ?Orders Placed This Encounter  ?Procedures  ? Ambulatory referral to Orthopedics  ?  Referral Priority:   Routine  ?  Referral Type:   Consultation  ?  Number of Visits Requested:   1  ? ?Meds ordered this encounter  ?Medications  ? DISCONTD: methocarbamol (ROBAXIN) 500 MG tablet  ?  Sig: Take 1 tablet (500 mg total) by mouth every 8 (eight) hours as needed for muscle spasms.  ?  Dispense:  30 tablet  ?  Refill:  2  ? ibuprofen (ADVIL) 800 MG tablet  ?  Sig: Take 1 tablet (800 mg total) by mouth every 8 (eight) hours as needed.  ?  Dispense:  30 tablet  ?  Refill:  5  ? DISCONTD: methocarbamol (ROBAXIN) 500 MG tablet  ?  Sig: Take 1 tablet (500 mg total) by mouth every 8 (eight) hours as needed for muscle spasms.  ?  Dispense:  30 tablet  ?  Refill:  5  ? methocarbamol 1000 MG TABS  ?  Sig: Take 1,000 mg by mouth every 8 (eight) hours as needed for muscle spasms.  ?  Dispense:  90 tablet  ?  Refill:  5  ? ?  ? ? Shelly Bombard, MD ?10/22/2021 4:32 PM  ?

## 2021-10-22 NOTE — Progress Notes (Signed)
Pt in office for f/u after pelvic US on 10/15/21. Pt only has concerns about plan of care for pain caused by fibroids.  ?

## 2021-11-05 ENCOUNTER — Telehealth (INDEPENDENT_AMBULATORY_CARE_PROVIDER_SITE_OTHER): Payer: 59 | Admitting: Obstetrics

## 2021-11-05 ENCOUNTER — Encounter: Payer: Self-pay | Admitting: Obstetrics

## 2021-11-05 DIAGNOSIS — Z6841 Body Mass Index (BMI) 40.0 and over, adult: Secondary | ICD-10-CM | POA: Diagnosis not present

## 2021-11-05 DIAGNOSIS — D251 Intramural leiomyoma of uterus: Secondary | ICD-10-CM

## 2021-11-05 DIAGNOSIS — M62830 Muscle spasm of back: Secondary | ICD-10-CM

## 2021-11-05 DIAGNOSIS — R102 Pelvic and perineal pain: Secondary | ICD-10-CM

## 2021-11-05 NOTE — Progress Notes (Signed)
Patient presents for Mychart follow up visit. Patient identified with two patient identifiers. Visit is to discuss follow up from u/s. ?

## 2021-11-05 NOTE — Progress Notes (Signed)
? ? ?GYNECOLOGY VIRTUAL VISIT ENCOUNTER NOTE ? ?Provider location: [facility] [place]  ? ?Patient location: Home ? ?I connected with Maureen Griffin on 11/05/21 at  4:10 PM EDT by MyChart Video Encounter and verified that I am speaking with the correct person using two identifiers. ?  ?I discussed the limitations, risks, security and privacy concerns of performing an evaluation and management service virtually and the availability of in person appointments. I also discussed with the patient that there may be a patient responsible charge related to this service. The patient expressed understanding and agreed to proceed. ?  ?History:  ?Maureen Griffin is a 36 y.o. [id number] female being evaluated today for pelvic pain.  Ultrasound done, and was WNL's. She denies any abnormal vaginal discharge, bleeding, pelvic pain or other concerns.   ?  ?  ?Past Medical History:  ?Diagnosis Date  ? Fibroid   ? Gestational diabetes   ? Hypertension   ? Morbid obesity ([place]) 04/24/2016  ? Pregnancy induced hypertension   ? ?Past Surgical History:  ?Procedure Laterality Date  ? NO PAST SURGERIES    ? ?The following portions of the patient's history were reviewed and updated as appropriate: allergies, current medications, past family history, past medical history, past social history, past surgical history and problem list.  ? ?Health Maintenance:  Normal pap and negative HRHPV on 12-24-2020 ? ?Review of Systems:  ?Pertinent items noted in HPI and remainder of comprehensive ROS otherwise negative. ? ?Physical Exam:  ? ?General:  Alert, oriented and cooperative. Patient appears to be in no acute distress.  ?Mental Status: Normal mood and affect. Normal behavior. Normal judgment and thought content.   ?Respiratory: Normal respiratory effort, no problems with respiration noted  ?Rest of physical exam deferred due to type of encounter ? ?Labs and Imaging ?No results found for this or any previous visit (from the past 336  hour(s)). ?US PELVIC COMPLETE WITH TRANSVAGINAL ? ?Result Date: 10/16/2021 ?CLINICAL DATA:  Initial evaluation for pelvic pain, evaluate fibroids. EXAM: TRANSABDOMINAL AND TRANSVAGINAL ULTRASOUND OF PELVIS TECHNIQUE: Both transabdominal and transvaginal ultrasound examinations of the pelvis were performed. Transabdominal technique was performed for global imaging of the pelvis including uterus, ovaries, adnexal regions, and pelvic cul-de-sac. It was necessary to proceed with endovaginal exam following the transabdominal exam to visualize the endometrium and ovaries. COMPARISON:  None available. FINDINGS: Uterus Measurements: 8.7 x 4.6 x 5.7 cm = volume: 119.9 mL. Uterus is anteverted. Several fibroids are seen as follows; 1. 2.5 x 2.4 x 2.4 cm subserosal fibroid present at the right mid uterine body. 2. 1.4 x 1.4 x 1.1 cm partially calcified intramural fibroid present at the central uterine body. 3. 2.5 x 1.9 x 2.8 cm exophytic fibroid extends from the posterior aspect of the lower uterine segment. Endometrium Thickness: 2.9 mm.  No focal abnormality visualized. Right ovary Measurements: 4.1 x 2.5 x 2.7 cm = volume: 14.8 mL. Normal appearance/no adnexal mass. Left ovary Measurements: 2.9 x 2.1 x 2.4 cm = volume: 7.5 mL. Normal appearance/no adnexal mass. Other findings No abnormal free fluid. IMPRESSION: 1. Fibroid uterus as detailed above, with the largest fibroid extending from the posterior aspect of the lower uterine segment measuring up to 2.8 cm. 2. Normal endometrial stripe measuring 2.9 mm in thickness. 3. Normal ovaries and adnexa. No adnexal mass or free fluid within the pelvis. Electronically Signed   By: Jeannine Boga M.D.   On: 10/16/2021 01:41     ?  ?  Assessment and Plan:  ?   ?1. Pelvic pain, resolved ? ?2. Intramural leiomyoma of uterus ?- small and asymptomatic ? ?3. Back muscle spasm ?- much improved with Flexeril / Ibuprofen ?- has Ortho referral ? ?4. Class 3 severe obesity due to excess  calories without serious comorbidity with body mass index (BMI) of 40.0 to 44.9 in adult (HCC) ?- weight reduction with the aid of dietary changes, exercise and behavioral modification recommended  ?   ?  ?I discussed the assessment and treatment plan with the patient. The patient was provided an opportunity to ask questions and all were answered. The patient agreed with the plan and demonstrated an understanding of the instructions. ?  ?The patient was advised to call back or seek an in-person evaluation/go to the ED if the symptoms worsen or if the condition fails to improve as anticipated. ? ?I have spent a total of 15 minutes of non-face-to-face time, excluding clinical staff time, reviewing notes and preparing to see patient, ordering tests and/or medications, and counseling the patient.  ? ? ?Baltazar Najjar, MD ?[facility], [place] ?11/05/21  ?

## 2022-04-25 ENCOUNTER — Ambulatory Visit
Admission: EM | Admit: 2022-04-25 | Discharge: 2022-04-25 | Disposition: A | Discharge status: Home / Self Care | Payer: 59 | Attending: Emergency Medicine | Admitting: Emergency Medicine

## 2022-04-25 DIAGNOSIS — J069 Acute upper respiratory infection, unspecified: Secondary | ICD-10-CM

## 2022-04-25 DIAGNOSIS — J309 Allergic rhinitis, unspecified: Secondary | ICD-10-CM

## 2022-04-25 DIAGNOSIS — J0140 Acute pansinusitis, unspecified: Secondary | ICD-10-CM | POA: Diagnosis not present

## 2022-04-25 MED ORDER — CETIRIZINE HCL 10 MG PO TABS: 10.0000 mg | ORAL_TABLET | Freq: Every day | ORAL | 1 refills | Status: AC

## 2022-04-25 MED ORDER — FLUTICASONE PROPIONATE 50 MCG/ACT NA SUSP: 1.0000 | Freq: Every day | NASAL | 2 refills | Status: AC

## 2022-04-25 MED ORDER — AZITHROMYCIN 250 MG PO TABS: ORAL_TABLET | ORAL | 0 refills | Status: AC

## 2022-04-25 NOTE — Discharge Instructions (Addendum)
Please read below to learn more about the medications, dosages and frequencies that I recommend to help alleviate your symptoms and to get you feeling better soon:   Z-Pak (azithromycin): Is an antibiotic to clean up any bacteria that is causing an infection in your sinuses.  Take 2 tablets the first day, then take 1 tablet daily every day thereafter until complete.  Zyrtec (cetirizine): This is an excellent second-generation antihistamine that helps to reduce respiratory inflammatory response to environmental allergens.  In some patients, this medication can cause daytime sleepiness so I recommend that you give your child this medication at bedtime every day.     Flonase (fluticasone): This is a steroid nasal spray that you use once daily, 1 spray in each nare.  This medication does not work well if you decide to use it only used as you feel you need to, it works best used on a daily basis.  After 3 to 5 days of use, you will notice significant reduction of the inflammation and mucus production that is currently being caused by exposure to allergens, whether seasonal or environmental.  The most common side effect of this medication is nosebleeds.  If you experience a nosebleed, please discontinue use for 1 week, then feel free to resume.  I have provided you with a prescription.     Advil, Motrin (ibuprofen): This is a good anti-inflammatory medication which addresses aches, pains and inflammation of the upper airways that causes sinus and nasal congestion as well as in the lower airways which makes your cough feel tight and sometimes burn.  I recommend that you take between 400 to 600 mg every 6-8 hours as needed.      Please follow-up within the next 5-7 days either with your primary care provider or urgent care if your symptoms do not resolve.  If you do not have a primary care provider, we will assist you in finding one.        Thank you for visiting urgent care today.  We appreciate the opportunity  to participate in your care.

## 2022-04-25 NOTE — ED Triage Notes (Signed)
Pt c/o headache, cough, body aches and fever for about 5 days.   Home interventions: tylenol, zyrtec

## 2022-04-25 NOTE — ED Provider Notes (Signed)
[facility]    CSN: [id number] Arrival date & time: 04/25/22  1142    HISTORY   Chief Complaint  Patient presents with   Cough   Headache   Generalized Body Aches   Fever   HPI Maureen Griffin is a pleasant, 36 y.o. female who presents to urgent care today. Patient complains of headache, cough, body aches and subjective fever for the past 5 days.  Patient states has been taking Tylenol and Zyrtec without meaningful relief of her symptoms.  The history is provided by the patient.  Cough Cough characteristics:  Non-productive Severity:  Moderate Onset quality:  Gradual Timing:  Constant Progression:  Waxing and waning Chronicity:  Recurrent Smoker: no   Context: exposure to allergens and upper respiratory infection   Context: not sick contacts   Relieved by:  Nothing Worsened by:  Deep breathing, activity and lying down Ineffective treatments:  Decongestant and cough suppressants Associated symptoms: fever and headaches   Fever:    Temp source:  Subjective   Progression:  Resolved Headache Pain location:  Frontal Quality:  Dull Radiates to:  Does not radiate Associated symptoms: cough and fever   Fever Associated symptoms: cough and headaches    Past Medical History:  Diagnosis Date   Fibroid    Gestational diabetes    Hypertension    Morbid obesity ([place]) 04/24/2016   Pregnancy induced hypertension    Patient Active Problem List   Diagnosis Date Noted   GERD (gastroesophageal reflux disease) 08/04/2018   Morbid obesity ([place]) 04/24/2016   Past Surgical History:  Procedure Laterality Date   NO PAST SURGERIES     OB History     Gravida  2   Para  2   Term  2   Preterm      AB      Living  2      SAB      IAB      Ectopic      Multiple  0   Live Births  2          Home Medications    Prior to Admission medications   Medication Sig Start Date End Date Taking? Authorizing Provider  ibuprofen (ADVIL) 800 MG tablet  Take 1 tablet (800 mg total) by mouth every 8 (eight) hours as needed. 10/22/21   Shelly Bombard, MD  methocarbamol 1000 MG TABS Take 1,000 mg by mouth every 8 (eight) hours as needed for muscle spasms. 10/22/21   Shelly Bombard, MD  Multiple Vitamin (MULTIVITAMIN WITH MINERALS) TABS tablet Take 1 tablet by mouth daily.    [provider]  norethindrone-ethinyl estradiol 1/35 ([place] 1/35, 28,) tablet Take 1 tablet by mouth daily. 08/26/21   Shelly Bombard, MD  phentermine 37.5 MG capsule Take 1 capsule (37.5 mg total) by mouth every morning. 09/28/20   Shelda Pal, DO    Family History Family History  Problem Relation Age of Onset   Healthy Mother    Cancer Neg Hx    Diabetes Neg Hx    Hypertension Neg Hx    Stroke Neg Hx    Social History Social History   Tobacco Use   Smoking status: Never   Smokeless tobacco: Never  Vaping Use   Vaping Use: Never used  Substance Use Topics   Alcohol use: No   Drug use: No   Allergies   Patient has no known allergies.  Review of Systems Review of Systems  Constitutional:  Positive for fever.  Respiratory:  Positive for cough.   Neurological:  Positive for headaches.   Pertinent findings revealed after performing a 14 point review of systems has been noted in the history of present illness.  Physical Exam Triage Vital Signs ED Triage Vitals  Enc Vitals Group     BP 05/02/21 0827 (!) 147/82     Pulse Rate 05/02/21 0827 72     Resp 05/02/21 0827 18     Temp 05/02/21 0827 98.3 F (36.8 C)     Temp Source 05/02/21 0827 Oral     SpO2 05/02/21 0827 98 %     Weight --      Height --      Head Circumference --      Peak Flow --      Pain Score 05/02/21 0826 5     Pain Loc --      Pain Edu? --      Excl. in [place]? --   No data found.  Updated Vital Signs BP 110/69 (BP Location: Left Arm)   Pulse 70   Temp (!) 97.5 F (36.4 C) (Oral)   Resp 16   LMP 04/08/2022   SpO2 97%   Physical Exam Vitals  and nursing note reviewed.  Constitutional:      General: She is not in acute distress.    Appearance: Normal appearance. She is not ill-appearing.  HENT:     Head: Normocephalic and atraumatic.     Salivary Glands: Right salivary gland is not diffusely enlarged or tender. Left salivary gland is not diffusely enlarged or tender.     Right Ear: Hearing, ear canal and external ear normal. No drainage. A middle ear effusion is present. There is no impacted cerumen. Tympanic membrane is bulging. Tympanic membrane is not injected or erythematous.     Left Ear: Hearing, ear canal and external ear normal. No drainage. A middle ear effusion is present. There is no impacted cerumen. Tympanic membrane is bulging. Tympanic membrane is not injected or erythematous.     Ears:     Comments: Bilateral EACs normal, both TMs bulging with clear fluid    Nose: Rhinorrhea present. No nasal deformity, septal deviation, signs of injury, nasal tenderness, mucosal edema or congestion. Rhinorrhea is clear.     Right Nostril: Occlusion present. No foreign body, epistaxis or septal hematoma.     Left Nostril: Occlusion present. No foreign body, epistaxis or septal hematoma.     Right Turbinates: Enlarged, swollen and pale.     Left Turbinates: Enlarged, swollen and pale.     Right Sinus: No maxillary sinus tenderness or frontal sinus tenderness.     Left Sinus: No maxillary sinus tenderness or frontal sinus tenderness.     Mouth/Throat:     Lips: Pink. No lesions.     Mouth: Mucous membranes are moist. No oral lesions.     Pharynx: Uvula midline. Posterior oropharyngeal erythema present. No pharyngeal swelling, oropharyngeal exudate or uvula swelling.     Tonsils: No tonsillar exudate. 0 on the right. 0 on the left.     Comments: Postnasal drip Eyes:     General: Lids are normal.        Right eye: No discharge.        Left eye: No discharge.     Extraocular Movements: Extraocular movements intact.      Conjunctiva/sclera: Conjunctivae normal.     Right eye: Right conjunctiva is not injected.  Left eye: Left conjunctiva is not injected.  Neck:     Trachea: Trachea and phonation normal.  Cardiovascular:     Rate and Rhythm: Normal rate and regular rhythm.     Pulses: Normal pulses.     Heart sounds: Normal heart sounds. No murmur heard.    No friction rub. No gallop.  Pulmonary:     Effort: Pulmonary effort is normal. No accessory muscle usage, prolonged expiration or respiratory distress.     Breath sounds: Normal breath sounds. No stridor, decreased air movement or transmitted upper airway sounds. No decreased breath sounds, wheezing, rhonchi or rales.  Chest:     Chest wall: No tenderness.  Musculoskeletal:        General: Normal range of motion.     Cervical back: Full passive range of motion without pain, normal range of motion and neck supple. Normal range of motion.  Lymphadenopathy:     Cervical: Cervical adenopathy present.     Right cervical: Superficial cervical adenopathy present.     Left cervical: Superficial cervical adenopathy present.  Skin:    General: Skin is warm and dry.     Findings: No erythema or rash.  Neurological:     General: No focal deficit present.     Mental Status: She is alert and oriented to person, place, and time.  Psychiatric:        Mood and Affect: Mood normal.        Behavior: Behavior normal.     Visual Acuity Right Eye Distance:   Left Eye Distance:   Bilateral Distance:    Right Eye Near:   Left Eye Near:    Bilateral Near:     UC Couse / Diagnostics / Procedures:     Radiology No results found.  Procedures Procedures (including critical care time) EKG  Pending results:  Labs Reviewed - No data to display  Medications Ordered in UC: Medications - No data to display  UC Diagnoses / Final Clinical Impressions(s)   I have reviewed the triage vital signs and the nursing notes.  Pertinent labs & imaging results  that were available during my care of the patient were reviewed by me and considered in my medical decision making (see chart for details).    Final diagnoses:  Allergic rhinitis, unspecified seasonality, unspecified trigger  Acute upper respiratory infection  Acute pansinusitis, recurrence not specified   Given duration of patient's symptoms and subjective fever, will provide patient with a prescription for azithromycin.  Patient also strongly encouraged to take allergy medications as have been prescribed for her today as this is the likely underlying cause for having acquired a virus and now bacterial infection.  Return precautions advised.  ED Prescriptions     Medication Sig Dispense Auth. Provider   cetirizine (ZYRTEC ALLERGY) 10 MG tablet Take 1 tablet (10 mg total) by mouth at bedtime. 90 tablet Lynden Oxford Scales, PA-C   fluticasone (FLONASE) 50 MCG/ACT nasal spray Place 1 spray into both nostrils daily. Begin by using 2 sprays in each nare daily for 3 to 5 days, then decrease to 1 spray in each nare daily. 15.8 mL Lynden Oxford Scales, PA-C   azithromycin (ZITHROMAX) 250 MG tablet Take 2 tablets (500 mg total) by mouth daily for 1 day, THEN 1 tablet (250 mg total) daily for 4 days. 6 tablet Lynden Oxford Scales, PA-C      PDMP not reviewed this encounter.  Disposition Upon Discharge:  Condition: stable for discharge home  Home: take medications as prescribed; routine discharge instructions as discussed; follow up as advised.  Patient presented with an acute illness with associated systemic symptoms and significant discomfort requiring urgent management. In my opinion, this is a condition that a prudent lay person (someone who possesses an average knowledge of health and medicine) may potentially expect to result in complications if not addressed urgently such as respiratory distress, impairment of bodily function or dysfunction of bodily organs.   Routine symptom specific,  illness specific and/or disease specific instructions were discussed with the patient and/or caregiver at length.   As such, the patient has been evaluated and assessed, work-up was performed and treatment was provided in alignment with urgent care protocols and evidence based medicine.  Patient/parent/caregiver has been advised that the patient may require follow up for further testing and treatment if the symptoms continue in spite of treatment, as clinically indicated and appropriate.  If the patient was tested for COVID-19, Influenza and/or RSV, then the patient/parent/guardian was advised to isolate at home pending the results of his/her diagnostic coronavirus test and potentially longer if they're positive. I have also advised pt that if his/her COVID-19 test returns positive, it's recommended to self-isolate for at least 10 days after symptoms first appeared AND until fever-free for 24 hours without fever reducer AND other symptoms have improved or resolved. Discussed self-isolation recommendations as well as instructions for household member/close contacts as per the [facility] and [place] DHHS, and also gave patient the [place] packet with this information.  Patient/parent/caregiver has been advised to return to the [facility] or PCP in 3-5 days if no better; to PCP or the Emergency Department if new signs and symptoms develop, or if the current signs or symptoms continue to change or worsen for further workup, evaluation and treatment as clinically indicated and appropriate  The patient will follow up with their current PCP if and as advised. If the patient does not currently have a PCP we will assist them in obtaining one.   The patient may need specialty follow up if the symptoms continue, in spite of conservative treatment and management, for further workup, evaluation, consultation and treatment as clinically indicated and appropriate.  Patient/parent/caregiver verbalized understanding and agreement of plan as  discussed.  All questions were addressed during visit.  Please see discharge instructions below for further details of plan.  Discharge Instructions:   Discharge Instructions      Please read below to learn more about the medications, dosages and frequencies that I recommend to help alleviate your symptoms and to get you feeling better soon:   Z-Pak (azithromycin): Is an antibiotic to clean up any bacteria that is causing an infection in your sinuses.  Take 2 tablets the first day, then take 1 tablet daily every day thereafter until complete.  Zyrtec (cetirizine): This is an excellent second-generation antihistamine that helps to reduce respiratory inflammatory response to environmental allergens.  In some patients, this medication can cause daytime sleepiness so I recommend that you give your child this medication at bedtime every day.     Flonase (fluticasone): This is a steroid nasal spray that you use once daily, 1 spray in each nare.  This medication does not work well if you decide to use it only used as you feel you need to, it works best used on a daily basis.  After 3 to 5 days of use, you will notice significant reduction of the inflammation and mucus production that is currently being caused by exposure to  allergens, whether seasonal or environmental.  The most common side effect of this medication is nosebleeds.  If you experience a nosebleed, please discontinue use for 1 week, then feel free to resume.  I have provided you with a prescription.     Advil, Motrin (ibuprofen): This is a good anti-inflammatory medication which addresses aches, pains and inflammation of the upper airways that causes sinus and nasal congestion as well as in the lower airways which makes your cough feel tight and sometimes burn.  I recommend that you take between 400 to 600 mg every 6-8 hours as needed.      Please follow-up within the next 5-7 days either with your primary care provider or urgent care if  your symptoms do not resolve.  If you do not have a primary care provider, we will assist you in finding one.        Thank you for visiting urgent care today.  We appreciate the opportunity to participate in your care.         This office note has been dictated using Museum/gallery curator.  Unfortunately, this method of dictation can sometimes lead to typographical or grammatical errors.  I apologize for your inconvenience in advance if this occurs.  Please do not hesitate to reach out to me if clarification is needed.      Lynden Oxford Scales, PA-C 04/25/22 1427

## 2022-06-19 ENCOUNTER — Other Ambulatory Visit (INDEPENDENT_AMBULATORY_CARE_PROVIDER_SITE_OTHER): Payer: 59

## 2022-06-19 ENCOUNTER — Encounter: Payer: Self-pay | Admitting: Family Medicine

## 2022-06-19 ENCOUNTER — Other Ambulatory Visit: Payer: Self-pay | Admitting: Family Medicine

## 2022-06-19 ENCOUNTER — Ambulatory Visit (INDEPENDENT_AMBULATORY_CARE_PROVIDER_SITE_OTHER): Payer: 59 | Admitting: Family Medicine

## 2022-06-19 VITALS — BP 138/78 | HR 72 | Temp 98.4°F | Ht 68.0 in | Wt 295.5 lb

## 2022-06-19 DIAGNOSIS — Z0000 Encounter for general adult medical examination without abnormal findings: Secondary | ICD-10-CM | POA: Diagnosis not present

## 2022-06-19 DIAGNOSIS — R739 Hyperglycemia, unspecified: Secondary | ICD-10-CM

## 2022-06-19 LAB — COMPREHENSIVE METABOLIC PANEL
ALT: 16 U/L (ref 0–35)
AST: 15 U/L (ref 0–37)
Albumin: 4.1 g/dL (ref 3.5–5.2)
Alkaline Phosphatase: 53 U/L (ref 39–117)
BUN: 7 mg/dL (ref 6–23)
CO2: 28 mEq/L (ref 19–32)
Calcium: 9.2 mg/dL (ref 8.4–10.5)
Chloride: 104 mEq/L (ref 96–112)
Creatinine, Ser: 0.68 mg/dL (ref 0.40–1.20)
GFR: 112.42 mL/min (ref >60.00)
Glucose, Bld: 101 mg/dL — ABNORMAL HIGH (ref 70–99)
Potassium: 4.6 mEq/L (ref 3.5–5.1)
Sodium: 137 mEq/L (ref 135–145)
Total Bilirubin: 0.5 mg/dL (ref 0.2–1.2)
Total Protein: 7 g/dL (ref 6.0–8.3)

## 2022-06-19 LAB — LIPID PANEL
Cholesterol: 151 mg/dL (ref 0–200)
HDL: 55.4 mg/dL (ref >39.00)
LDL Cholesterol: 81 mg/dL (ref 0–99)
NonHDL: 95.34
Total CHOL/HDL Ratio: 3
Triglycerides: 70 mg/dL (ref 0.0–149.0)
VLDL: 14 mg/dL (ref 0.0–40.0)

## 2022-06-19 LAB — CBC
HCT: 38.8 % (ref 36.0–46.0)
Hemoglobin: 13.2 g/dL (ref 12.0–15.0)
MCHC: 34.1 g/dL (ref 30.0–36.0)
MCV: 82.2 fl (ref 78.0–100.0)
Platelets: 306 10*3/uL (ref 150.0–400.0)
RBC: 4.72 Mil/uL (ref 3.87–5.11)
RDW: 14.7 % (ref 11.5–15.5)
WBC: 7.3 10*3/uL (ref 4.0–10.5)

## 2022-06-19 LAB — HEMOGLOBIN A1C: Hgb A1c MFr Bld: 5.7 % (ref 4.6–6.5)

## 2022-06-19 NOTE — Progress Notes (Signed)
Chief Complaint  Patient presents with   Annual Exam     Well Woman Maureen Griffin is here for a complete physical.   Her last physical was >1 year ago.  Current diet: in general, a "pretty OK" diet. Current exercise: treadmill, lifting wts, stair master. Fatigue out of ordinary? No Seatbelt? Yes Advanced directive? No  Health Maintenance Pap/HPV- Yes Tetanus- Yes HIV screening- Yes Hep C screening- Yes  Past Medical History:  Diagnosis Date   Fibroid    Gestational diabetes    Hypertension    Morbid obesity ([place]) 04/24/2016   Pregnancy induced hypertension      Past Surgical History:  Procedure Laterality Date   NO PAST SURGERIES      Medications  Current Outpatient Medications on File Prior to Visit  Medication Sig Dispense Refill   cetirizine (ZYRTEC ALLERGY) 10 MG tablet Take 1 tablet (10 mg total) by mouth at bedtime. 90 tablet 1   fluticasone (FLONASE) 50 MCG/ACT nasal spray Place 1 spray into both nostrils daily. Begin by using 2 sprays in each nare daily for 3 to 5 days, then decrease to 1 spray in each nare daily. 15.8 mL 2   ibuprofen (ADVIL) 800 MG tablet Take 1 tablet (800 mg total) by mouth every 8 (eight) hours as needed. 30 tablet 5   methocarbamol 1000 MG TABS Take 1,000 mg by mouth every 8 (eight) hours as needed for muscle spasms. 90 tablet 5   Multiple Vitamin (MULTIVITAMIN WITH MINERALS) TABS tablet Take 1 tablet by mouth daily.     norethindrone-ethinyl estradiol 1/35 ([place] 1/35, 28,) tablet Take 1 tablet by mouth daily. 28 tablet 11   Allergies No Known Allergies  Review of Systems: Constitutional:  no unexpected weight changes Eye:  no recent significant change in vision Ear/Nose/Mouth/Throat:  Ears:  no tinnitus or vertigo and no recent change in hearing Nose/Mouth/Throat:  no complaints of nasal congestion, no sore throat Cardiovascular: no chest pain Respiratory:  no cough and no shortness of breath Gastrointestinal:  no abdominal  pain, no change in bowel habits GU:  Female: negative for dysuria or pelvic pain Musculoskeletal/Extremities:  no pain of the joints Integumentary (Skin/Breast):  no abnormal skin lesions reported Neurologic:  no headaches Endocrine:  denies fatigue Hematologic/Lymphatic:  No areas of easy bleeding  Exam BP 138/78 (BP Location: Left Arm, Patient Position: Sitting, Cuff Size: Large)   Pulse 72   Temp 98.4 F (36.9 C) (Oral)   Ht '5\' 8"'$  (1.727 m)   Wt 295 lb 8 oz (134 kg)   SpO2 98%   BMI 44.93 kg/m  General:  well developed, well nourished, in no apparent distress Skin:  no significant moles, warts, or growths Head:  no masses, lesions, or tenderness Eyes:  pupils equal and round, sclera anicteric without injection Ears:  canals without lesions, TMs shiny without retraction, no obvious effusion, no erythema Nose:  nares patent, mucosa normal, and no drainage  Throat/Pharynx:  lips and gingiva without lesion; tongue and uvula midline; non-inflamed pharynx; no exudates or postnasal drainage Neck: neck supple without adenopathy, thyromegaly, or masses Lungs:  clear to auscultation, breath sounds equal bilaterally, no respiratory distress Cardio:  regular rate and rhythm, no bruits, no LE edema Abdomen:  abdomen soft, nontender; bowel sounds normal; no masses or organomegaly Genital: Defer to GYN Musculoskeletal:  symmetrical muscle groups noted without atrophy or deformity Extremities:  no clubbing, cyanosis, or edema, no deformities, no skin discoloration Neuro:  gait normal; deep  tendon reflexes normal and symmetric Psych: well oriented with normal range of affect and appropriate judgment/insight  Assessment and Plan  Well adult exam - Plan: CBC, Comprehensive metabolic panel, Lipid panel   Well 37 y.o. female. Counseled on diet and exercise. Advanced directive form provided today.  Other orders as above. Covid vaccine rec'd.  She is moving to [place], we will not likely  see each other again. She will let us know if she needs anything.  The patient voiced understanding and agreement to the plan.  [place], DO 06/19/22 9:03 AM

## 2022-06-19 NOTE — Patient Instructions (Addendum)
Give Maureen Griffin 2-3 business days to get the results of your labs back.   Keep the diet clean and stay active.  Please get me a copy of your advanced directive form at your convenience.   Good luck in the future!  Let Maureen Griffin know if you need anything.

## 2023-06-22 DIAGNOSIS — E611 Iron deficiency: Secondary | ICD-10-CM | POA: Diagnosis not present

## 2023-06-22 DIAGNOSIS — Z0000 Encounter for general adult medical examination without abnormal findings: Secondary | ICD-10-CM | POA: Diagnosis not present

## 2023-06-22 DIAGNOSIS — E538 Deficiency of other specified B group vitamins: Secondary | ICD-10-CM | POA: Diagnosis not present

## 2023-06-22 DIAGNOSIS — E559 Vitamin D deficiency, unspecified: Secondary | ICD-10-CM | POA: Diagnosis not present

## 2023-07-02 DIAGNOSIS — E559 Vitamin D deficiency, unspecified: Secondary | ICD-10-CM | POA: Diagnosis not present

## 2023-07-02 DIAGNOSIS — Z7184 Encounter for health counseling related to travel: Secondary | ICD-10-CM | POA: Diagnosis not present

## 2023-07-02 DIAGNOSIS — D72820 Lymphocytosis (symptomatic): Secondary | ICD-10-CM | POA: Diagnosis not present

## 2023-07-29 DIAGNOSIS — Z6841 Body Mass Index (BMI) 40.0 and over, adult: Secondary | ICD-10-CM | POA: Diagnosis not present

## 2023-07-29 DIAGNOSIS — G4733 Obstructive sleep apnea (adult) (pediatric): Secondary | ICD-10-CM | POA: Diagnosis not present

## 2023-07-29 DIAGNOSIS — Z5971 Insufficient health insurance coverage: Secondary | ICD-10-CM | POA: Diagnosis not present

## 2023-07-29 DIAGNOSIS — Z7184 Encounter for health counseling related to travel: Secondary | ICD-10-CM | POA: Diagnosis not present

## 2023-08-25 DIAGNOSIS — J101 Influenza due to other identified influenza virus with other respiratory manifestations: Secondary | ICD-10-CM | POA: Diagnosis not present

## 2023-12-28 DIAGNOSIS — R5382 Chronic fatigue, unspecified: Secondary | ICD-10-CM | POA: Diagnosis not present

## 2023-12-28 DIAGNOSIS — E010 Iodine-deficiency related diffuse (endemic) goiter: Secondary | ICD-10-CM | POA: Diagnosis not present

## 2023-12-28 DIAGNOSIS — E785 Hyperlipidemia, unspecified: Secondary | ICD-10-CM | POA: Diagnosis not present

## 2023-12-31 DIAGNOSIS — R5382 Chronic fatigue, unspecified: Secondary | ICD-10-CM | POA: Diagnosis not present

## 2023-12-31 DIAGNOSIS — E785 Hyperlipidemia, unspecified: Secondary | ICD-10-CM | POA: Diagnosis not present

## 2024-06-15 DIAGNOSIS — Z1331 Encounter for screening for depression: Secondary | ICD-10-CM | POA: Diagnosis not present

## 2024-06-15 DIAGNOSIS — Z713 Dietary counseling and surveillance: Secondary | ICD-10-CM | POA: Diagnosis not present

## 2024-06-15 DIAGNOSIS — R5383 Other fatigue: Secondary | ICD-10-CM | POA: Diagnosis not present

## 2024-06-15 DIAGNOSIS — Z0000 Encounter for general adult medical examination without abnormal findings: Secondary | ICD-10-CM | POA: Diagnosis not present

## 2024-06-15 DIAGNOSIS — E66813 Obesity, class 3: Secondary | ICD-10-CM | POA: Diagnosis not present

## 2024-06-15 DIAGNOSIS — Z131 Encounter for screening for diabetes mellitus: Secondary | ICD-10-CM | POA: Diagnosis not present
